# Patient Record
Sex: Male | Born: 1960 | Race: Black or African American | Hispanic: No | Smoking: Current some day smoker
Health system: Southern US, Community
[De-identification: ages and names within clinical notes are randomized; demographics above are authoritative.]

## PROBLEM LIST (undated history)

## (undated) DIAGNOSIS — E119 Type 2 diabetes mellitus without complications: Secondary | ICD-10-CM

## (undated) DIAGNOSIS — E785 Hyperlipidemia, unspecified: Secondary | ICD-10-CM

## (undated) DIAGNOSIS — I10 Essential (primary) hypertension: Secondary | ICD-10-CM

## (undated) DIAGNOSIS — F32A Depression, unspecified: Secondary | ICD-10-CM

## (undated) DIAGNOSIS — H409 Unspecified glaucoma: Secondary | ICD-10-CM

## (undated) DIAGNOSIS — M1990 Unspecified osteoarthritis, unspecified site: Secondary | ICD-10-CM

## (undated) DIAGNOSIS — G629 Polyneuropathy, unspecified: Secondary | ICD-10-CM

## (undated) DIAGNOSIS — Z8619 Personal history of other infectious and parasitic diseases: Secondary | ICD-10-CM

## (undated) DIAGNOSIS — F419 Anxiety disorder, unspecified: Secondary | ICD-10-CM

## (undated) HISTORY — DX: Type 2 diabetes mellitus without complications: E11.9

## (undated) HISTORY — DX: Essential (primary) hypertension: I10

## (undated) HISTORY — DX: Personal history of other infectious and parasitic diseases: Z86.19

## (undated) HISTORY — DX: Unspecified glaucoma: H40.9

## (undated) HISTORY — DX: Polyneuropathy, unspecified: G62.9

## (undated) HISTORY — PX: WISDOM TOOTH EXTRACTION: SHX21

## (undated) HISTORY — DX: Hyperlipidemia, unspecified: E78.5

---

## 2001-05-04 HISTORY — PX: VASECTOMY: SHX75

## 2001-05-04 HISTORY — PX: CYSTECTOMY: SUR359

## 2002-05-04 DIAGNOSIS — N529 Male erectile dysfunction, unspecified: Secondary | ICD-10-CM | POA: Insufficient documentation

## 2004-02-18 ENCOUNTER — Ambulatory Visit: Payer: Self-pay | Admitting: Family Medicine

## 2008-05-14 DIAGNOSIS — G4700 Insomnia, unspecified: Secondary | ICD-10-CM | POA: Insufficient documentation

## 2008-10-18 ENCOUNTER — Ambulatory Visit: Payer: Self-pay | Admitting: Family Medicine

## 2010-06-05 IMAGING — CR DG KNEE 1-2V*L*
1 series · 2 of 2 positions shown · non-contrast
Comparison: none

REASON FOR EXAM: knee pain
COMMENTS:

[Series 2: view not recorded · 0.17mm/px · 2 of 2 slices shown]
[im 1/2]
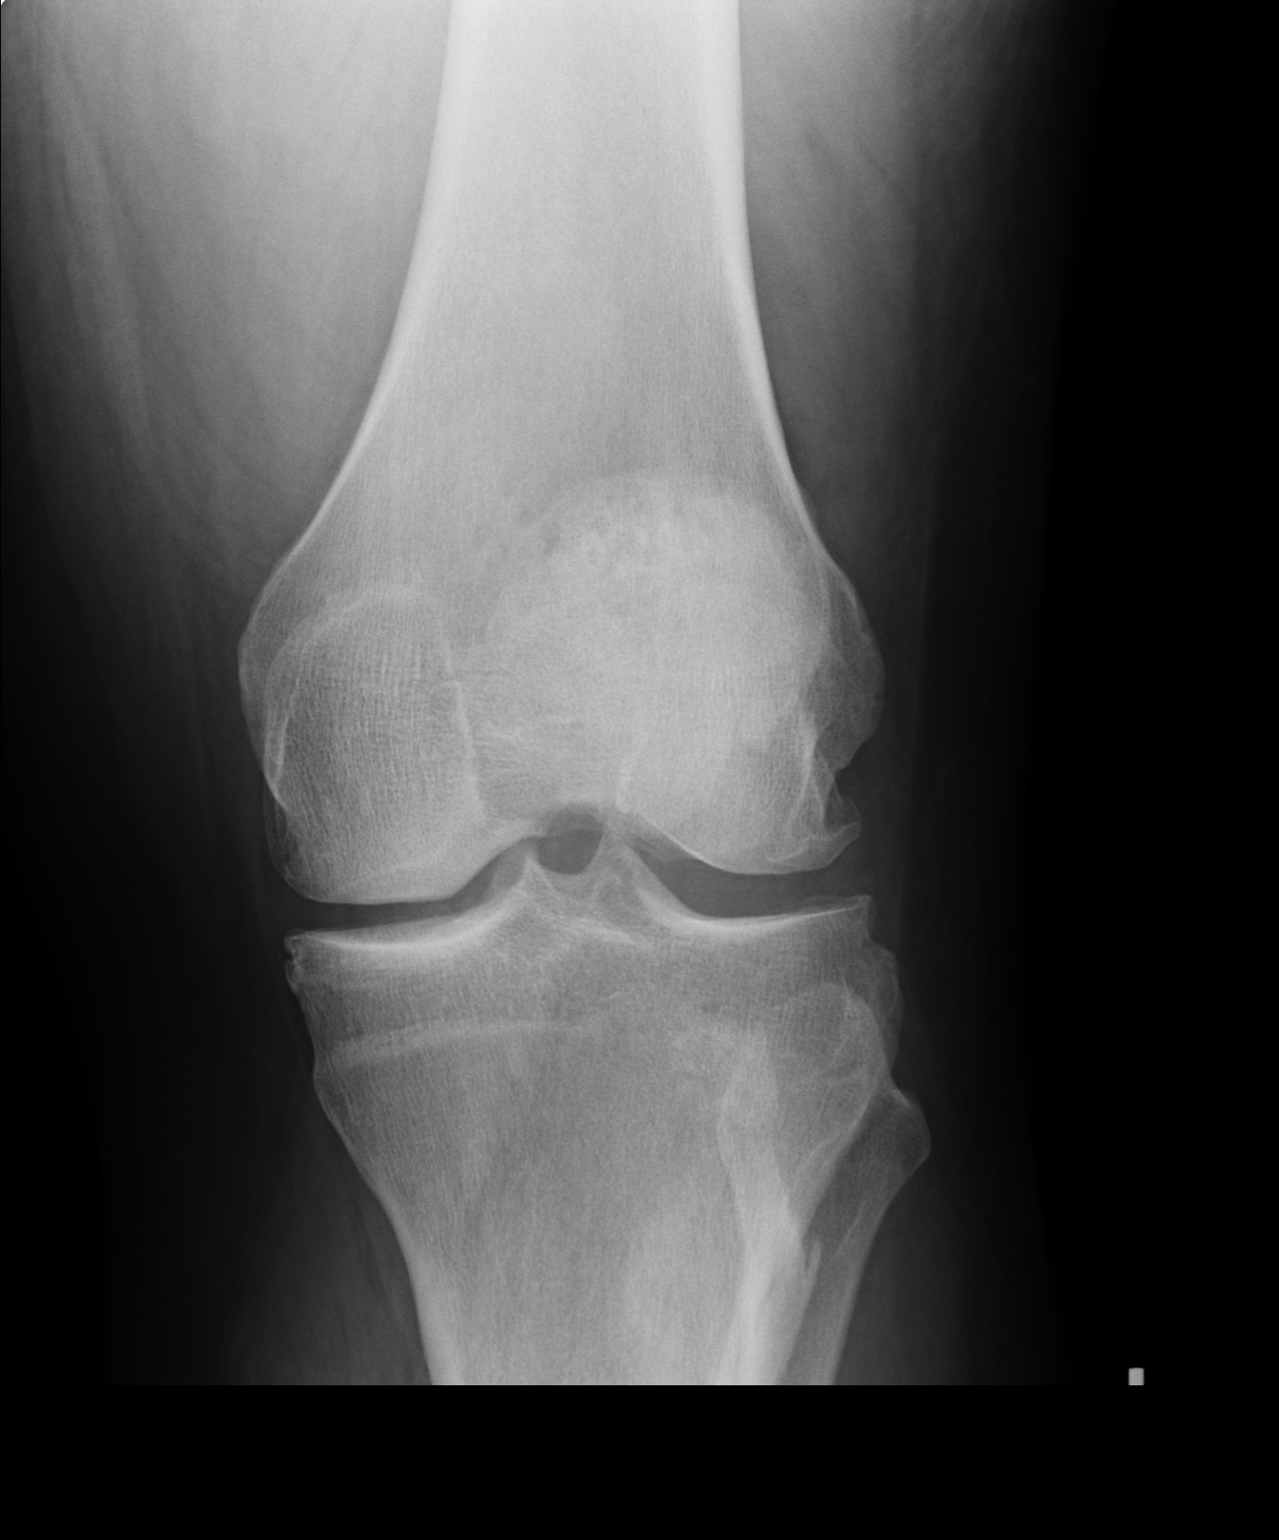
[im 2/2]
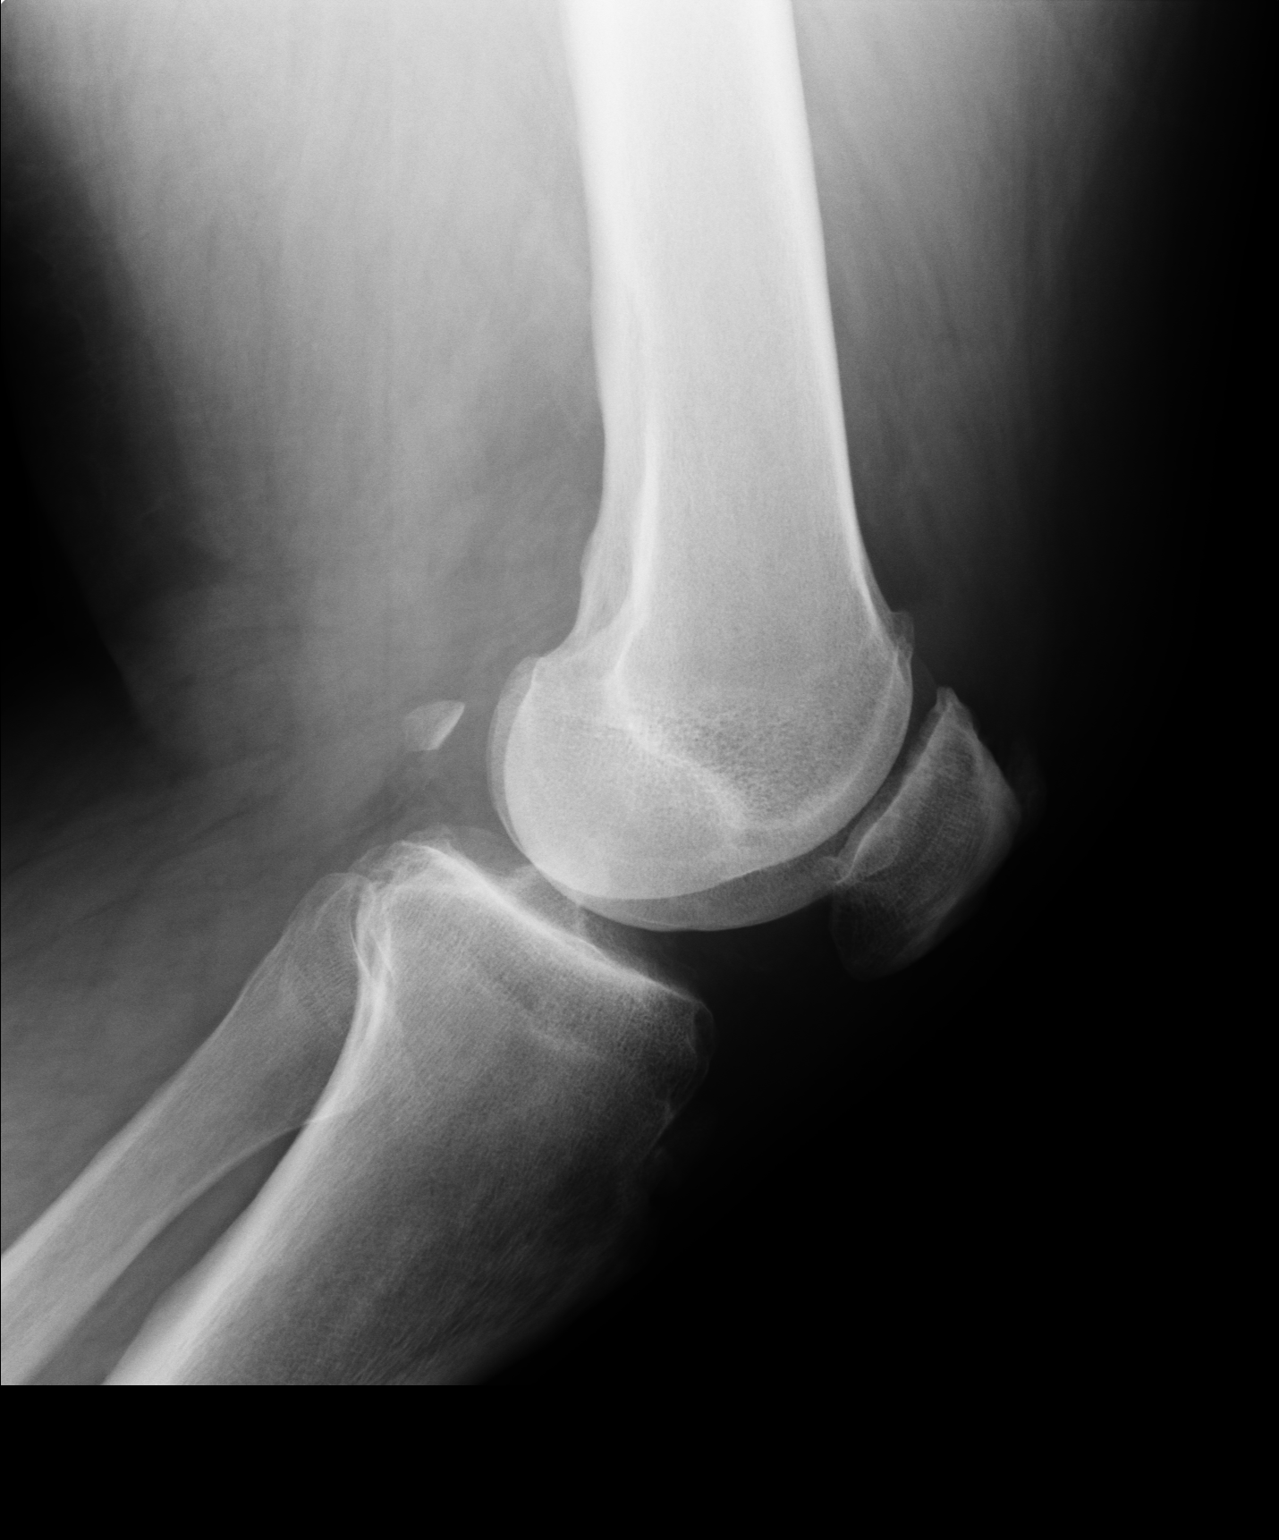

[2 of 2 positions shown; findings below may reference images not displayed]

PROCEDURE:     KDR - KDXR KNEE LEFT AP AND LATERAL  - October 18, 2008 [DATE]

RESULT:     No fracture, dislocation or other acute bony abnormality is
identified. There is slight degenerative spurring at the knee medially and
laterally. In the lateral view, there is noted slight dorsal patella
spurring. No sclerosis or cystic changes about the knee are seen.
IMPRESSION: 1. There is mild degenerative spurring at the knee joint and at the
femoropatellar joint.
2. The knee joint space appears slightly narrowed along its medial aspect.
3. No cystic changes or sclerosis about the knee joint is seen.

## 2011-02-18 LAB — BASIC METABOLIC PANEL
BUN: 13 mg/dL (ref 4–21)
CREATININE: 0.9 mg/dL (ref 0.6–1.3)
POTASSIUM: 4.1 mmol/L (ref 3.4–5.3)
SODIUM: 140 mmol/L (ref 137–147)

## 2011-08-14 ENCOUNTER — Other Ambulatory Visit (HOSPITAL_COMMUNITY): Payer: Self-pay | Admitting: Nephrology

## 2011-08-14 DIAGNOSIS — N186 End stage renal disease: Secondary | ICD-10-CM

## 2011-08-18 ENCOUNTER — Encounter (HOSPITAL_COMMUNITY): Payer: Self-pay | Admitting: Pharmacy Technician

## 2011-08-19 ENCOUNTER — Ambulatory Visit (HOSPITAL_COMMUNITY): Admission: RE | Admit: 2011-08-19 | Payer: Self-pay | Source: Ambulatory Visit

## 2011-12-15 LAB — LIPID PANEL
Cholesterol: 209 mg/dL — AB (ref 0–200)
HDL: 47 mg/dL (ref 35–70)
LDL Cholesterol: 138 mg/dL
Triglycerides: 118 mg/dL (ref 40–160)

## 2014-04-02 LAB — BASIC METABOLIC PANEL: Glucose: 206 mg/dL

## 2014-04-02 LAB — HEMOGLOBIN A1C: Hemoglobin A1C: >14

## 2014-12-29 ENCOUNTER — Other Ambulatory Visit: Payer: Self-pay | Admitting: Family Medicine

## 2015-01-09 ENCOUNTER — Other Ambulatory Visit: Payer: Self-pay | Admitting: Family Medicine

## 2015-01-18 ENCOUNTER — Other Ambulatory Visit: Payer: Self-pay | Admitting: Family Medicine

## 2015-02-01 ENCOUNTER — Other Ambulatory Visit: Payer: Self-pay

## 2015-02-01 DIAGNOSIS — I10 Essential (primary) hypertension: Secondary | ICD-10-CM | POA: Insufficient documentation

## 2015-02-01 MED ORDER — LISINOPRIL-HYDROCHLOROTHIAZIDE 20-25 MG PO TABS: 1.0000 | ORAL_TABLET | Freq: Every day | ORAL | Status: DC

## 2015-02-01 NOTE — Telephone Encounter (Signed)
Pharmacy requesting refill. [facility] [address] Patient's las OV for hypertension was 04/02/14.

## 2015-04-22 ENCOUNTER — Other Ambulatory Visit: Payer: Self-pay | Admitting: Family Medicine

## 2015-05-24 ENCOUNTER — Other Ambulatory Visit: Payer: Self-pay | Admitting: Family Medicine

## 2015-05-24 DIAGNOSIS — I10 Essential (primary) hypertension: Secondary | ICD-10-CM

## 2015-05-25 NOTE — Telephone Encounter (Signed)
Patient has not been seen in over a year. He needs to schedule follow up office visit before medications can be refilled.

## 2015-05-27 NOTE — Telephone Encounter (Signed)
Pt advised;  Made appointment for 06/07/2015 at 8am.  He is asking for a refill to get him until his appointment.   Thanks,   -Vernona Rieger

## 2015-06-05 DIAGNOSIS — H409 Unspecified glaucoma: Secondary | ICD-10-CM | POA: Insufficient documentation

## 2015-06-05 DIAGNOSIS — G629 Polyneuropathy, unspecified: Secondary | ICD-10-CM | POA: Insufficient documentation

## 2015-06-07 ENCOUNTER — Ambulatory Visit: Payer: Self-pay | Admitting: Family Medicine

## 2015-06-17 ENCOUNTER — Ambulatory Visit: Payer: Self-pay | Admitting: Family Medicine

## 2015-07-29 ENCOUNTER — Ambulatory Visit (INDEPENDENT_AMBULATORY_CARE_PROVIDER_SITE_OTHER): Payer: Self-pay | Admitting: Family Medicine

## 2015-07-29 ENCOUNTER — Encounter: Payer: Self-pay | Admitting: Family Medicine

## 2015-07-29 VITALS — BP 192/102 | HR 100 | Temp 98.2°F | Resp 16 | Ht 76.0 in | Wt 380.0 lb

## 2015-07-29 DIAGNOSIS — E1165 Type 2 diabetes mellitus with hyperglycemia: Secondary | ICD-10-CM

## 2015-07-29 DIAGNOSIS — I10 Essential (primary) hypertension: Secondary | ICD-10-CM

## 2015-07-29 DIAGNOSIS — Z91199 Patient's noncompliance with other medical treatment and regimen due to unspecified reason: Secondary | ICD-10-CM

## 2015-07-29 DIAGNOSIS — IMO0001 Reserved for inherently not codable concepts without codable children: Secondary | ICD-10-CM

## 2015-07-29 DIAGNOSIS — Z9119 Patient's noncompliance with other medical treatment and regimen: Secondary | ICD-10-CM

## 2015-07-29 LAB — POCT GLYCOSYLATED HEMOGLOBIN (HGB A1C)
ESTIMATED AVERAGE GLUCOSE: 286
HEMOGLOBIN A1C: 11.6

## 2015-07-29 MED ORDER — CEPHALEXIN 500 MG PO CAPS: 500.0000 mg | ORAL_CAPSULE | Freq: Four times a day (QID) | ORAL | Status: AC

## 2015-07-29 MED ORDER — AMLODIPINE BESYLATE 5 MG PO TABS: 5.0000 mg | ORAL_TABLET | Freq: Every day | ORAL | Status: DC

## 2015-07-29 MED ORDER — LISINOPRIL-HYDROCHLOROTHIAZIDE 20-25 MG PO TABS: 1.0000 | ORAL_TABLET | Freq: Every day | ORAL | Status: DC

## 2015-07-29 MED ORDER — METFORMIN HCL 1000 MG PO TABS: 1000.0000 mg | ORAL_TABLET | Freq: Two times a day (BID) | ORAL | Status: DC

## 2015-07-29 MED ORDER — LIRAGLUTIDE 18 MG/3ML ~~LOC~~ SOPN: PEN_INJECTOR | SUBCUTANEOUS | Status: DC

## 2015-07-29 NOTE — Progress Notes (Signed)
Patient: [name] Jensen Male    DOB: 10/29/1960   55 y.o.   MRN: [id number] Visit Date: 07/29/2015  Today's Provider: Mila Merry, MD   Chief Complaint  Patient presents with  . Diabetes    follow up  . Hypertension    follow up  . Hyperlipidemia    follow up   Subjective:    HPI  Diabetes Mellitus Type II, Follow-up:   Lab Results  Component Value Date   HGBA1C >14.0 04/02/2014   Last seen for diabetes 16  months ago.  Management since then includes starting Farxiga  daily. He reports fair compliance with treatment. Has been out of Metformin for a few weeks. He is not having side effects.  Current symptoms include numbness in feet and have been stable. Home blood sugar records: not being checked  Episodes of hypoglycemia? no   Current Insulin Regimen:none Most Recent Eye Exam: less than 1 year Weight trend: increasing steadily Prior visit with dietician: no Current diet: in general, a "healthy" diet   Current exercise: none  ------------------------------------------------------------------------   Hypertension, follow-up:  BP Readings from Last 3 Encounters:  04/02/14 142/94    He was last seen for hypertension 16  months ago.  BP at that visit was 142/94. Management since that visit includes no changes .He reports fair compliance with treatment. Has been out of Lisinopril for a few weeks. Patient has not been taking Amlodipine due to cost; patient is unemployed and has no insurance.  He is not having side effects.  He is not exercising. He is adherent to low salt diet.   Outside blood pressures are not being checked. He is experiencing dyspnea, fatigue, lower extremity edema and palpitations.  Patient denies chest pain, chest pressure/discomfort, exertional chest pressure/discomfort, irregular heart beat and near-syncope.   Cardiovascular risk factors include diabetes mellitus, dyslipidemia, hypertension, male gender, sedentary lifestyle and  smoking/ tobacco exposure.  Use of agents associated with hypertension: none.   ------------------------------------------------------------------------    Lipid/Cholesterol, Follow-up:   Last seen for this 3 years ago.  Management since that visit includes no changes.  Last Lipid Panel:    Component Value Date/Time   CHOL 209* 12/15/2011   TRIG 118 12/15/2011   HDL 47 12/15/2011   LDLCALC 138 12/15/2011    He reports good compliance with treatment. He is not having side effects.   Wt Readings from Last 3 Encounters:  04/02/14 361 lb (163.749 kg)    ------------------------------------------------------------------------      No Known Allergies Previous Medications   AMLODIPINE (NORVASC) 5 MG TABLET    Take 1 tablet by mouth daily.   BRIMONIDINE (ALPHAGAN P) 0.1 % SOLN    Place 1 drop into both eyes 2 (two) times daily.   GLIMEPIRIDE (AMARYL) 4 MG TABLET    take 1 tablet once daily   LATANOPROST (XALATAN) 0.005 % OPHTHALMIC SOLUTION    XALATAN, 0.005% (Ophthalmic Solution)  for 0 days  Quantity: 0.00;  Refills: 0   Ordered :11-Feb-2010  Mila Merry MD;  Started 04-May-2006 Active   LISINOPRIL-HYDROCHLOROTHIAZIDE (PRINZIDE,ZESTORETIC) 20-25 MG TABLET    take 1 tablet by mouth once daily   METFORMIN (GLUCOPHAGE) 1000 MG TABLET    Take 1 tablet by mouth 2 (two) times daily.   He is still taking Glimepiride. He states that Victoza worked well for him in the past, but could no afford prescription.   Review of Systems  Constitutional: Positive for fatigue. Negative for fever,  chills and appetite change.  Respiratory: Positive for shortness of breath. Negative for chest tightness and wheezing.   Cardiovascular: Positive for leg swelling (both legs). Negative for chest pain and palpitations.  Gastrointestinal: Negative for nausea, vomiting and abdominal pain.  Endocrine: Negative for cold intolerance, heat intolerance, polydipsia, polyphagia and polyuria.    Neurological: Positive for numbness (in feet) and headaches.    Social History  Substance Use Topics  . Smoking status: Current Some Day Smoker -- 23 years    Types: Cigars  . Smokeless tobacco: Not on file     Comment: smokes 3 cigars a week.   . Alcohol Use: 0.0 oz/week    0 Standard drinks or equivalent per week     Comment: occasionally    Objective:   BP 192/102 mmHg  Pulse 100  Temp(Src) 98.2 F (36.8 C) (Oral)  Resp 16  Ht 6\' 4"  (1.93 m)  Wt 380 lb (172.367 kg)  BMI 46.27 kg/m2  Physical Exam  General Appearance:    Alert, cooperative, no distress, obese  Eyes:    PERRL, conjunctiva/corneas clear, EOM's intact       Lungs:     Clear to auscultation bilaterally, respirations unlabored  Heart:    Regular rate and rhythm  Neurologic:   Awake, alert, oriented x 3. No apparent focal neurological           defect.       Results for orders placed or performed in visit on 07/29/15  POCT HgB A1C  Result Value Ref Range   Hemoglobin A1C 11.6    Est. average glucose Bld gHb Est-mCnc 286        Assessment & Plan:     1. Non compliance with medical treatment   2. Uncontrolled type 2 diabetes mellitus without complication, without long-term current use of insulin (HCC) Start back on metformin and add samples of Victoza which he did well with in the past.  - POCT HgB A1C  3. Essential hypertension Currently off of medications. Extensive counseled on danger of stroke and MI with untreated hypertension. Refilled lisinopril and amlodipine Advised that he needs to have renal functions before next refill.        Mila Merryonald Fisher, MD  [facility]

## 2015-07-29 NOTE — Patient Instructions (Addendum)
You need to get your kidney functions checked before your medications are refilled in April

## 2015-08-26 ENCOUNTER — Telehealth: Payer: Self-pay | Admitting: Family Medicine

## 2015-08-26 DIAGNOSIS — I10 Essential (primary) hypertension: Secondary | ICD-10-CM

## 2015-08-26 NOTE — Telephone Encounter (Signed)
Patient was notified. Patient will come by for labs and Bp check.

## 2015-08-26 NOTE — Telephone Encounter (Signed)
Please advise patient it is time to check labs since starting back on his blood pressure medications. He needs renal panel and needs to have BP checked when picks up lab slip. He is self pay, so lab has been ordered as Account Bill'.  Thanks.

## 2015-08-27 NOTE — Telephone Encounter (Signed)
Patient's Bp was 120/80.

## 2015-08-28 DIAGNOSIS — Z0000 Encounter for general adult medical examination without abnormal findings: Secondary | ICD-10-CM

## 2015-08-29 LAB — RENAL FUNCTION PANEL
ALBUMIN: 4.2 g/dL (ref 3.5–5.5)
BUN/Creatinine Ratio: 13 (ref 9–20)
BUN: 12 mg/dL (ref 6–24)
CHLORIDE: 95 mmol/L — AB (ref 96–106)
CO2: 27 mmol/L (ref 18–29)
Calcium: 10.1 mg/dL (ref 8.7–10.2)
Creatinine, Ser: 0.9 mg/dL (ref 0.76–1.27)
GFR calc non Af Amer: 98 mL/min/{1.73_m2} (ref >59)
GFR, EST AFRICAN AMERICAN: 113 mL/min/{1.73_m2} (ref >59)
GLUCOSE: 191 mg/dL — AB (ref 65–99)
PHOSPHORUS: 3.9 mg/dL (ref 2.5–4.5)
POTASSIUM: 5.1 mmol/L (ref 3.5–5.2)
SODIUM: 141 mmol/L (ref 134–144)

## 2015-09-22 ENCOUNTER — Other Ambulatory Visit: Payer: Self-pay | Admitting: Family Medicine

## 2015-11-29 ENCOUNTER — Telehealth: Payer: Self-pay | Admitting: Family Medicine

## 2015-11-29 NOTE — Telephone Encounter (Signed)
Pt called to see if he could get samples of Liraglutide (VICTOZA) 18 MG/3ML SOPN if we had any samples in the office. I advised pt that Dr. Sherrie Mustache is out of the office until 12/03/15. Please advise. Thanks TNP

## 2015-12-27 ENCOUNTER — Other Ambulatory Visit: Payer: Self-pay | Admitting: Family Medicine

## 2015-12-27 NOTE — Telephone Encounter (Signed)
LOV 07/29/2015. Allene DillonEmily Drozdowski, CMA

## 2016-02-04 ENCOUNTER — Other Ambulatory Visit: Payer: Self-pay | Admitting: Family Medicine

## 2016-02-04 NOTE — Telephone Encounter (Signed)
Patient has uncontrolled with a1c of 11 in March. He needs follow up o.v. Have sent refill to get by for a few weeks until he can be seen.

## 2016-03-06 ENCOUNTER — Ambulatory Visit (INDEPENDENT_AMBULATORY_CARE_PROVIDER_SITE_OTHER): Payer: Self-pay | Admitting: Family Medicine

## 2016-03-06 ENCOUNTER — Encounter: Payer: Self-pay | Admitting: Family Medicine

## 2016-03-06 VITALS — BP 130/80 | HR 85 | Temp 98.7°F | Resp 16 | Ht 76.0 in | Wt 367.0 lb

## 2016-03-06 DIAGNOSIS — IMO0001 Reserved for inherently not codable concepts without codable children: Secondary | ICD-10-CM

## 2016-03-06 DIAGNOSIS — Z9119 Patient's noncompliance with other medical treatment and regimen: Secondary | ICD-10-CM

## 2016-03-06 DIAGNOSIS — Z91199 Patient's noncompliance with other medical treatment and regimen due to unspecified reason: Secondary | ICD-10-CM

## 2016-03-06 DIAGNOSIS — I10 Essential (primary) hypertension: Secondary | ICD-10-CM

## 2016-03-06 DIAGNOSIS — E1165 Type 2 diabetes mellitus with hyperglycemia: Secondary | ICD-10-CM

## 2016-03-06 LAB — POCT GLYCOSYLATED HEMOGLOBIN (HGB A1C)

## 2016-03-06 MED ORDER — DAPAGLIFLOZIN PROPANEDIOL 10 MG PO TABS: ORAL_TABLET | ORAL | 0 refills | Status: DC

## 2016-03-06 MED ORDER — DULAGLUTIDE 0.75 MG/0.5ML ~~LOC~~ SOAJ: 0.7500 mg | SUBCUTANEOUS | 3 refills | Status: DC

## 2016-03-06 MED ORDER — AMLODIPINE BESYLATE 5 MG PO TABS: 5.0000 mg | ORAL_TABLET | Freq: Every day | ORAL | 2 refills | Status: DC

## 2016-03-06 MED ORDER — LISINOPRIL-HYDROCHLOROTHIAZIDE 20-25 MG PO TABS: 1.0000 | ORAL_TABLET | Freq: Every day | ORAL | 3 refills | Status: DC

## 2016-03-06 MED ORDER — CEPHALEXIN 500 MG PO CAPS: 500.0000 mg | ORAL_CAPSULE | Freq: Four times a day (QID) | ORAL | 2 refills | Status: DC

## 2016-03-06 NOTE — Progress Notes (Signed)
Patient: [name] Jensen Male    DOB: May 17, 1960   55 y.o.   MRN: [id number] Visit Date: 03/06/2016  Today's Provider: Mila Merryonald Shandrell Boda, MD   Chief Complaint  Patient presents with  . Follow-up  . Diabetes  . Hypertension   Subjective:    Patient is having a numb feeling in his feet. Patient also said he has a sluggish feeling most of the time.     Diabetes Mellitus Type II, Follow-up:   Lab Results  Component Value Date   HGBA1C >14.0 03/06/2016   HGBA1C 11.6 07/29/2015   HGBA1C >14.0 04/02/2014   Last seen for diabetes 8 months ago.  Management since then includes; started back on metformin and added samples of victoza. He states he feels very fatigued when he takes 1.8mg  of Victoza, so he occasionally stops for a few days, and has recently only been taking 0.6mg .  He is not having side effects. none Current symptoms include none and have been unchanged. Home blood sugar records: fasting range: not checking  Episodes of hypoglycemia? no   Current Insulin Regimen: n/a Most Recent Eye Exam: due Weight trend: stable Prior visit with dietician: no Current diet: well balanced Current exercise: none  ----------------------------------------------------------------   Hypertension, follow-up:  BP Readings from Last 3 Encounters:  03/06/16 130/80  07/29/15 (!) 192/102  04/02/14 (!) 142/94    He was last seen for hypertension 8 months ago.  BP at that visit was 192/102. Management since that visit includes; patient counseled to take meds.He reports good compliance with treatment. He is not having side effects. none He is not exercising. He is not adherent to low salt diet.   Outside blood pressures are n/a. He is experiencing none.  Patient denies none.   Cardiovascular risk factors include diabetes.  Use of agents associated with hypertension: none.   ----------------------------------------------------------------    No Known Allergies   Current  Outpatient Prescriptions:  .  amLODipine (NORVASC) 5 MG tablet, Take 1 tablet (5 mg total) by mouth daily. Reported on 07/29/2015, Disp: 30 tablet, Rfl: 2 .  brimonidine (ALPHAGAN P) 0.1 % SOLN, Place 1 drop into both eyes 2 (two) times daily., Disp: , Rfl:  .  cephALEXin (KEFLEX) 500 MG capsule, Take 1 capsule by mouth 4 (four) times daily., Disp: , Rfl:  .  glimepiride (AMARYL) 4 MG tablet, take 1 tablet once daily, Disp: 15 tablet, Rfl: 0 .  latanoprost (XALATAN) 0.005 % ophthalmic solution, XALATAN, 0.005% (Ophthalmic Solution)  for 0 days  Quantity: 0.00;  Refills: 0   Ordered :11-Feb-2010  Mila MerryFisher, Elim Peale MD;  Started 04-May-2006 Active, Disp: , Rfl:  .  Liraglutide (VICTOZA) 18 MG/3ML SOPN, Titrate up to 1.8mg  daily, Disp: 12 mL, Rfl: 0 .  lisinopril-hydrochlorothiazide (PRINZIDE,ZESTORETIC) 20-25 MG tablet, take 1 tablet by mouth once daily ....Marland Kitchen.PATIENT NEEDS TO SCHEDULE OFFICE VISIT FOR FOLLOW UP, Disp: 15 tablet, Rfl: 0 .  metFORMIN (GLUCOPHAGE) 1000 MG tablet, take 1 tablet by mouth twice a day, Disp: 60 tablet, Rfl: 2  Review of Systems  Constitutional: Negative for appetite change, chills and fever.  Respiratory: Negative for chest tightness, shortness of breath and wheezing.   Cardiovascular: Negative for chest pain and palpitations.  Gastrointestinal: Negative for abdominal pain, nausea and vomiting.  Neurological: Positive for numbness.    Social History  Substance Use Topics  . Smoking status: Current Some Day Smoker    Years: 23.00    Types: Cigars  . Smokeless tobacco:  Not on file     Comment: smokes 3 cigars a week.   . Alcohol use 0.0 oz/week     Comment: occasionally    Objective:   BP 130/80 (BP Location: Right Arm, Patient Position: Sitting, Cuff Size: Large)   Pulse 85   Temp 98.7 F (37.1 C) (Oral)   Resp 16   Ht 6\' 4"  (1.93 m)   Wt (!) 367 lb (166.5 kg)   SpO2 96%   BMI 44.67 kg/m   Physical Exam  General Appearance:    Alert, cooperative, no  distress, morbidly obese  Eyes:    PERRL, conjunctiva/corneas clear, EOM's intact       Lungs:     Clear to auscultation bilaterally, respirations unlabored  Heart:    Regular rate and rhythm  Neurologic:   Awake, alert, oriented x 3. No apparent focal neurological           defect.       Results for orders placed or performed in visit on 03/06/16  POCT glycosylated hemoglobin (Hb A1C)  Result Value Ref Range   Hemoglobin A1C >14.0    Est. average glucose Bld gHb Est-mCnc >326         Assessment & Plan:     1. Uncontrolled diabetes mellitus type 2 without complications, unspecified long term insulin use status (HCC) Not tolerating therapeutic dose of Victoza. Start on ThayerFarxiga and recheck in 4-6 weeks.  - POCT glycosylated hemoglobin (Hb A1C) - dapagliflozin propanediol (FARXIGA) 10 MG TABS tablet; Take 1/2 tablet daily  Dispense: 28 tablet; Refill: 0  2. Essential hypertension Well controlled since being compliant with medications.  - amLODipine (NORVASC) 5 MG tablet; Take 1 tablet (5 mg total) by mouth daily. Reported on 07/29/2015  Dispense: 30 tablet; Refill: 2 - lisinopril-hydrochlorothiazide (PRINZIDE,ZESTORETIC) 20-25 MG tablet; Take 1 tablet by mouth daily.  Dispense: 30 tablet; Refill: 3  3. Non compliance with medical treatment        Mila Merryonald Tambi Thole, MD  [facility]

## 2016-03-06 NOTE — Patient Instructions (Signed)
Diabetes and Foot Care Diabetes may cause you to have problems because of poor blood supply (circulation) to your feet and legs. This may cause the skin on your feet to become thinner, break easier, and heal more slowly. Your skin may become dry, and the skin may peel and crack. You may also have nerve damage in your legs and feet causing decreased feeling in them. You may not notice minor injuries to your feet that could lead to infections or more serious problems. Taking care of your feet is one of the most important things you can do for yourself.  HOME CARE INSTRUCTIONS  Wear shoes at all times, even in the house. Do not go barefoot. Bare feet are easily injured.  Check your feet daily for blisters, cuts, and redness. If you cannot see the bottom of your feet, use a mirror or ask someone for help.  Wash your feet with warm water (do not use hot water) and mild soap. Then pat your feet and the areas between your toes until they are completely dry. Do not soak your feet as this can dry your skin.  Apply a moisturizing lotion or petroleum jelly (that does not contain alcohol and is unscented) to the skin on your feet and to dry, brittle toenails. Do not apply lotion between your toes.  Trim your toenails straight across. Do not dig under them or around the cuticle. File the edges of your nails with an emery board or nail file.  Do not cut corns or calluses or try to remove them with medicine.  Wear clean socks or stockings every day. Make sure they are not too tight. Do not wear knee-high stockings since they may decrease blood flow to your legs.  Wear shoes that fit properly and have enough cushioning. To break in new shoes, wear them for just a few hours a day. This prevents you from injuring your feet. Always look in your shoes before you put them on to be sure there are no objects inside.  Do not cross your legs. This may decrease the blood flow to your feet.  If you find a minor scrape,  cut, or break in the skin on your feet, keep it and the skin around it clean and dry. These areas may be cleansed with mild soap and water. Do not cleanse the area with peroxide, alcohol, or iodine.  When you remove an adhesive bandage, be sure not to damage the skin around it.  If you have a wound, look at it several times a day to make sure it is healing.  Do not use heating pads or hot water bottles. They may burn your skin. If you have lost feeling in your feet or legs, you may not know it is happening until it is too late.  Make sure your health care provider performs a complete foot exam at least annually or more often if you have foot problems. Report any cuts, sores, or bruises to your health care provider immediately. SEEK MEDICAL CARE IF:   You have an injury that is not healing.  You have cuts or breaks in the skin.  You have an ingrown nail.  You notice redness on your legs or feet.  You feel burning or tingling in your legs or feet.  You have pain or cramps in your legs and feet.  Your legs or feet are numb.  Your feet always feel cold. SEEK IMMEDIATE MEDICAL CARE IF:   There is increasing redness,   swelling, or pain in or around a wound.  There is a red line that goes up your leg.  Pus is coming from a wound.  You develop a fever or as directed by your health care provider.  You notice a bad smell coming from an ulcer or wound.   This information is not intended to replace advice given to you by your health care provider. Make sure you discuss any questions you have with your health care provider.   Document Released: 04/17/2000 Document Revised: 12/21/2012 Document Reviewed: 09/27/2012 Elsevier Interactive Patient Education 2016 [employer]  

## 2016-03-07 ENCOUNTER — Other Ambulatory Visit: Payer: Self-pay | Admitting: Family Medicine

## 2016-04-06 ENCOUNTER — Telehealth: Payer: Self-pay | Admitting: Family Medicine

## 2016-04-06 DIAGNOSIS — IMO0001 Reserved for inherently not codable concepts without codable children: Secondary | ICD-10-CM

## 2016-04-06 DIAGNOSIS — E1165 Type 2 diabetes mellitus with hyperglycemia: Principal | ICD-10-CM

## 2016-04-06 MED ORDER — DAPAGLIFLOZIN PROPANEDIOL 10 MG PO TABS: ORAL_TABLET | ORAL | 0 refills | Status: DC

## 2016-04-06 NOTE — Telephone Encounter (Signed)
Please check with patient and see if has been taking samples of Marcelline DeistFarxiga that we got for him. If tolerating well then we can get him more samples. I don't think we have any more Farxiga, but we can substitute samples of Jardiance 10mg  one a day. He can have 6 sample packs and schedule follow up in 5-6 weeks.

## 2016-04-06 NOTE — Telephone Encounter (Signed)
We did have 6 weeks of farxiga 10 mg. Patient was notified.

## 2016-04-06 NOTE — Telephone Encounter (Signed)
Patient stated that he is still taking samples of Farxiga. Patient stated that he has been tolerating them week and he has about 1 1/2 weeks of samples left.

## 2016-04-17 ENCOUNTER — Ambulatory Visit: Payer: Self-pay | Admitting: Family Medicine

## 2016-06-23 ENCOUNTER — Telehealth: Payer: Self-pay | Admitting: Family Medicine

## 2016-06-23 DIAGNOSIS — E1165 Type 2 diabetes mellitus with hyperglycemia: Principal | ICD-10-CM

## 2016-06-23 DIAGNOSIS — IMO0001 Reserved for inherently not codable concepts without codable children: Secondary | ICD-10-CM

## 2016-06-23 NOTE — Telephone Encounter (Signed)
Please contact patient and let him know we have 6 weeks of Farxiga 10mg  samples that he can pick up.

## 2016-06-23 NOTE — Telephone Encounter (Signed)
LMOVM for pt to return call 

## 2016-06-23 NOTE — Telephone Encounter (Signed)
-----   Message from Malva Limesonald E Fisher, MD sent at 04/13/2016  8:04 AM EST ----- Regarding: follow up early jan. for farxiga. is on samples.    ----- Message ----- From: Malva Limesonald E Fisher, MD Sent: 04/13/2016 To: Malva Limesonald E Fisher, MD Subject: FW: check to see if tolerating Marcelline DeistFarxiga early#  Message to fisher nurse 04/06/16 ----- Message ----- From: Malva Limesonald E Fisher, MD Sent: 04/04/2016 To: Malva Limesonald E Fisher, MD Subject: check to see if tolerating Marcelline DeistFarxiga early Dec#

## 2016-06-25 MED ORDER — DAPAGLIFLOZIN PROPANEDIOL 10 MG PO TABS: ORAL_TABLET | ORAL | 0 refills | Status: DC

## 2016-06-25 NOTE — Telephone Encounter (Signed)
Patient was notified.

## 2016-06-25 NOTE — Addendum Note (Signed)
Addended by: Marlene LardMILLER, Lavelle Berland M on: 06/25/2016 08:49 AM   Modules accepted: Orders

## 2016-07-10 ENCOUNTER — Other Ambulatory Visit: Payer: Self-pay | Admitting: Family Medicine

## 2016-07-10 DIAGNOSIS — I10 Essential (primary) hypertension: Secondary | ICD-10-CM

## 2016-08-14 ENCOUNTER — Other Ambulatory Visit: Payer: Self-pay | Admitting: Family Medicine

## 2016-08-17 ENCOUNTER — Telehealth: Payer: Self-pay | Admitting: Family Medicine

## 2016-08-17 NOTE — Telephone Encounter (Signed)
LMTCB ED 

## 2016-08-17 NOTE — Telephone Encounter (Signed)
Please check with patient to see if he is doing ok with [place]. If so we have more samples of Farxiga 10. He can have 5 boxes and need to schedule follow up in 3-4 weeks to check a1c

## 2016-08-17 NOTE — Telephone Encounter (Signed)
-----   Message from Malva Limes, MD sent at 06/25/2016  3:07 PM EST ----- Regarding: smaples farxiga 10 and o.v. in April.    ----- Message ----- From: Malva Limes, MD Sent: 06/24/2016 To: Malva Limes, MD Subject: FW: follow up early jan. for farxiga. is on #  Message sent 06-23-2016  ----- Message ----- From: Malva Limes, MD Sent: 05/08/2016 To: Malva Limes, MD Subject: follow up early jan. for farxiga. is on samp#    ----- Message ----- From: Malva Limes, MD Sent: 04/13/2016 To: Malva Limes, MD Subject: FW: check to see if tolerating Marcelline Deist early#  Message to Tatsuya Okray nurse 04/06/16 ----- Message ----- From: Malva Limes, MD Sent: 04/04/2016 To: Malva Limes, MD Subject: check to see if tolerating Marcelline Deist early Dec#

## 2016-09-15 ENCOUNTER — Ambulatory Visit: Payer: Self-pay | Admitting: Family Medicine

## 2016-09-22 ENCOUNTER — Ambulatory Visit: Payer: Self-pay | Admitting: Family Medicine

## 2016-10-02 ENCOUNTER — Telehealth: Payer: Self-pay

## 2016-10-02 NOTE — Telephone Encounter (Signed)
FYI::: Patient had to cancel follow up appointment scheduled for 6/5 and wanted to let Dr. Sherrie MustacheFisher know he will call back to reschedule appointment when he can, but at this time he is unable to. Patient did not give a reason.

## 2016-10-02 NOTE — Telephone Encounter (Signed)
[id number] 

## 2016-10-06 ENCOUNTER — Ambulatory Visit: Payer: Self-pay | Admitting: Family Medicine

## 2016-11-03 ENCOUNTER — Telehealth: Payer: Self-pay | Admitting: *Deleted

## 2016-11-03 MED ORDER — DAPAGLIFLOZIN PROPANEDIOL 10 MG PO TABS: ORAL_TABLET | ORAL | 0 refills | Status: DC

## 2016-11-03 NOTE — Telephone Encounter (Signed)
Samples for farxiga at front desk for pickup.

## 2016-11-12 ENCOUNTER — Other Ambulatory Visit: Payer: Self-pay | Admitting: Family Medicine

## 2016-11-12 DIAGNOSIS — I10 Essential (primary) hypertension: Secondary | ICD-10-CM

## 2016-11-23 ENCOUNTER — Encounter: Payer: Self-pay | Admitting: Family Medicine

## 2016-11-23 ENCOUNTER — Ambulatory Visit (INDEPENDENT_AMBULATORY_CARE_PROVIDER_SITE_OTHER): Payer: Self-pay | Admitting: Family Medicine

## 2016-11-23 VITALS — BP 120/70 | HR 94 | Temp 98.9°F | Resp 16 | Wt 362.0 lb

## 2016-11-23 DIAGNOSIS — E1149 Type 2 diabetes mellitus with other diabetic neurological complication: Secondary | ICD-10-CM

## 2016-11-23 DIAGNOSIS — E1165 Type 2 diabetes mellitus with hyperglycemia: Secondary | ICD-10-CM

## 2016-11-23 LAB — POCT GLYCOSYLATED HEMOGLOBIN (HGB A1C)
ESTIMATED AVERAGE GLUCOSE: 263
HEMOGLOBIN A1C: 10.8

## 2016-11-23 NOTE — Patient Instructions (Signed)
Recommend taking 81mg  enteric coated aspirin to reduce risk of vascular events such as heart attacks and strokes.     Start taking 1,000mg  vitamin B12 (dissolvable tablets) for neuropathy   Diabetic Neuropathy Diabetic neuropathy is a nerve disease or nerve damage that is caused by diabetes mellitus. About half of all people with diabetes mellitus have some form of nerve damage. Nerve damage is more common in those who have had diabetes mellitus for many years and who generally have not had good control of their blood sugar (glucose) level. Diabetic neuropathy is a common complication of diabetes mellitus. There are three common types of diabetic neuropathy and a fourth type that is less common and less understood:  Peripheral neuropathy-This is the most common type of diabetic neuropathy. It causes damage to the nerves of the feet and legs first and then eventually the hands and arms. The damage affects the ability to sense touch.  Autonomic neuropathy-This type causes damage to the autonomic nervous system, which controls the following functions: ? Heartbeat. ? Body temperature. ? Blood pressure. ? Urination. ? Digestion. ? Sweating. ? Sexual function.  Focal neuropathy-Focal neuropathy can be painful and unpredictable and occurs most often in older adults with diabetes mellitus. It involves a specific nerve or one area and often comes on suddenly. It usually does not cause long-term problems.  Radiculoplexus neuropathy- Sometimes called lumbosacral radiculoplexus neuropathy, radiculoplexus neuropathy affects the nerves of the thighs, hips, buttocks, or legs. It is more common in people with type 2 diabetes mellitus and in older men. It is characterized by debilitating pain, weakness, and atrophy, usually in the thigh muscles.  What are the causes? The cause of peripheral, autonomic, and focal neuropathies is diabetes mellitus that is uncontrolled and high glucose levels. The cause of  radiculoplexus neuropathy is unknown. However, it is thought to be caused by inflammation related to uncontrolled glucose levels. What are the signs or symptoms? Peripheral Neuropathy Peripheral neuropathy develops slowly over time. When the nerves of the feet and legs no longer work there may be:  Burning, stabbing, or aching pain in the legs or feet.  Inability to feel pressure or pain in your feet. This can lead to: ? Thick calluses over pressure areas. ? Pressure sores. ? Ulcers.  Foot deformities.  Reduced ability to feel temperature changes.  Muscle weakness.  Autonomic Neuropathy The symptoms of autonomic neuropathy vary depending on which nerves are affected. Symptoms may include:  Problems with digestion, such as: ? Feeling sick to your stomach (nausea). ? Vomiting. ? Bloating. ? Constipation. ? Diarrhea. ? Abdominal pain.  Difficulty with urination. This occurs if you lose your ability to sense when your bladder is full. Problems include: ? Urine leakage (incontinence). ? Inability to empty your bladder completely (retention).  Rapid or irregular heartbeat (palpitations).  Blood pressure drops when you stand up (orthostatic hypotension). When you stand up you may feel: ? Dizzy. ? Weak. ? Faint.  In men, inability to attain and maintain an erection.  In women, vaginal dryness and problems with decreased sexual desire and arousal.  Problems with body temperature regulation.  Increased or decreased sweating.  Focal Neuropathy  Abnormal eye movements or abnormal alignment of both eyes.  Weakness in the wrist.  Foot drop. This results in an inability to lift the foot properly and abnormal walking or foot movement.  Paralysis on one side of your face (Bell palsy).  Chest or abdominal pain. Radiculoplexus Neuropathy  Sudden, severe pain in your  hip, thigh, or buttocks.  Weakness and wasting of thigh muscles.  Difficulty rising from a seated  position.  Abdominal swelling.  Unexplained weight loss (usually more than 10 lb [4.5 kg]). How is this diagnosed? Peripheral Neuropathy Your senses may be tested. Sensory function testing can be done with:  A light touch using a monofilament.  A vibration with tuning fork.  A sharp sensation with a pin prick.  Other tests that can help diagnose neuropathy are:  Nerve conduction velocity. This test checks the transmission of an electrical current through a nerve.  Electromyography. This shows how muscles respond to electrical signals transmitted by nearby nerves.  Quantitative sensory testing. This is used to assess how your nerves respond to vibrations and changes in temperature.  Autonomic Neuropathy Diagnosis is often based on reported symptoms. Tell your health care provider if you experience:  Dizziness.  Constipation.  Diarrhea.  Inappropriate urination or inability to urinate.  Inability to get or maintain an erection.  Tests that may be done include:  Electrocardiography or Holter monitor. These are tests that can help show problems with the heart rate or heart rhythm.  An X-ray exam may be done.  Focal Neuropathy Diagnosis is made based on your symptoms and what your health care provider finds during your exam. Other tests may be done. They may include:  Nerve conduction velocities. This checks the transmission of electrical current through a nerve.  Electromyography. This shows how muscles respond to electrical signals transmitted by nearby nerves.  Quantitative sensory testing. This test is used to assess how your nerves respond to vibration and changes in temperature.  Radiculoplexus Neuropathy  Often the first thing is to eliminate any other issue or problems that might be the cause, as there is no standard test for diagnosis.  X-ray exam of your spine and lumbar region.  Spinal tap to rule out cancer.  MRI to rule out other lesions. How is  this treated? Once nerve damage occurs, it cannot be reversed. The goal of treatment is to keep the disease or nerve damage from getting worse and affecting more nerve fibers. Controlling your blood glucose level is the key. Most people with radiculoplexus neuropathy see at least a partial improvement over time. You will need to keep your blood glucose and HbA1c levels in the target range determined by your health care provider. Things that help control blood glucose levels include:  Blood glucose monitoring.  Meal planning.  Physical activity.  Diabetes medicine.  Over time, maintaining lower blood glucose levels helps lessen symptoms. Sometimes, prescription pain medicine is needed. Follow these instructions at home:  Do not smoke.  Keep your blood glucose level in the range that you and your health care provider have determined acceptable for you.  Keep your blood pressure level in the range that you and your health care provider have determined acceptable for you.  Eat a well-balanced diet.  Be physically active every day. Include strength training and balance exercises.  Protect your feet. ? Check your feet every day for sores, cuts, blisters, or signs of infection. ? Wear padded socks and supportive shoes. Use orthotic inserts, if necessary. ? Regularly check the insides of your shoes for worn spots. Make sure there are no rocks or other items inside your shoes before you put them on. Contact a health care provider if:  You have burning, stabbing, or aching pain in the legs or feet.  You are unable to feel pressure or pain in your  feet.  You develop problems with digestion such as: ? Nausea. ? Vomiting. ? Bloating. ? Constipation. ? Diarrhea. ? Abdominal pain.  You have difficulty with urination, such as: ? Incontinence. ? Retention.  You have palpitations.  You develop orthostatic hypotension. When you stand up you may feel: ? Dizzy. ? Weak. ? Faint.  You  cannot attain and maintain an erection (in men).  You have vaginal dryness and problems with decreased sexual desire and arousal (in women).  You have severe pain in your thighs, legs, or buttocks.  You have unexplained weight loss. This information is not intended to replace advice given to you by your health care provider. Make sure you discuss any questions you have with your health care provider. Document Released: 06/29/2001 Document Revised: 09/26/2015 Document Reviewed: 09/29/2012 Elsevier Interactive Patient Education  2017 [employer]

## 2016-11-23 NOTE — Progress Notes (Signed)
Patient: [name] ReamerLarry D Benyo Male    DOB: 27-Apr-1961   56 y.o.   MRN: [id number] Visit Date: 11/23/2016  Today's Provider: Mila Merryonald Fisher, MD   Chief Complaint  Patient presents with  . Hypertension    follow up  . Diabetes    follow up   Subjective:    HPI  Hypertension, follow-up:  BP Readings from Last 3 Encounters:  03/06/16 130/80  07/29/15 (!) 192/102  04/02/14 (!) 142/94    He was last seen for hypertension 8 months ago.  BP at that visit was 130/80. Management since that visit includes no changes. He reports good compliance with treatment. He is not having side effects.  He is exercising. He is adherent to low salt diet.   Outside blood pressures are 125/80. He is experiencing none.  Patient denies chest pain, chest pressure/discomfort, claudication, dyspnea, exertional chest pressure/discomfort, fatigue, irregular heart beat, lower extremity edema, near-syncope, orthopnea, palpitations, paroxysmal nocturnal dyspnea, syncope and tachypnea.   Cardiovascular risk factors include diabetes mellitus, hypertension and male gender.  Use of agents associated with hypertension: none.     Weight trend: fluctuating a bit Wt Readings from Last 3 Encounters:  03/06/16 (!) 367 lb (166.5 kg)  07/29/15 (!) 380 lb (172.4 kg)  04/02/14 (!) 361 lb (163.7 kg)    Current diet: well balanced  ------------------------------------------------------------------------  Diabetes Mellitus Type II, Follow-up:   Lab Results  Component Value Date   HGBA1C >14.0 03/06/2016   HGBA1C 11.6 07/29/2015   HGBA1C >14.0 04/02/2014    Last seen for diabetes 8 months ago.  Management since then includes starting Farxiga. He reports fair compliance with treatment. He is not having side effects.  Current symptoms include paresthesia of the feet and have been stable. Home blood sugar records: checks blood sugars occasionally  Episodes of hypoglycemia? no   Current Insulin Regimen:  none Most Recent Eye Exam: < 1 year ago Weight trend: fluctuating a bit Prior visit with dietician: no Current diet: well balanced Current exercise: walking  Pertinent Labs:    Component Value Date/Time   CHOL 209 (A) 12/15/2011   TRIG 118 12/15/2011   HDL 47 12/15/2011   LDLCALC 138 12/15/2011   CREATININE 0.90 08/28/2015 1404    Wt Readings from Last 3 Encounters:  03/06/16 (!) 367 lb (166.5 kg)  07/29/15 (!) 380 lb (172.4 kg)  04/02/14 (!) 361 lb (163.7 kg)    ------------------------------------------------------------------------     No Known Allergies   Current Outpatient Prescriptions:  .  amLODipine (NORVASC) 5 MG tablet, Take 1 tablet (5 mg total) by mouth daily. Reported on 07/29/2015, Disp: 30 tablet, Rfl: 2 .  brimonidine (ALPHAGAN P) 0.1 % SOLN, Place 1 drop into both eyes 2 (two) times daily., Disp: , Rfl:  .  dapagliflozin propanediol (FARXIGA) 10 MG TABS tablet, Take 1 tablet daily, Disp: 28 tablet, Rfl: 0 .  glimepiride (AMARYL) 4 MG tablet, take 1 tablet once daily, Disp: 30 tablet, Rfl: 3 .  latanoprost (XALATAN) 0.005 % ophthalmic solution, XALATAN, 0.005% (Ophthalmic Solution)  for 0 days  Quantity: 0.00;  Refills: 0   Ordered :11-Feb-2010  Mila MerryFisher, Donald MD;  Started 04-May-2006 Active, Disp: , Rfl:  .  lisinopril-hydrochlorothiazide (PRINZIDE,ZESTORETIC) 20-25 MG tablet, take 1 tablet by mouth once daily, Disp: 30 tablet, Rfl: 1 .  metFORMIN (GLUCOPHAGE) 1000 MG tablet, take 1 tablet by mouth twice a day with food, Disp: 60 tablet, Rfl: 0  Review of Systems  Constitutional: Negative for appetite change, chills and fever.  Respiratory: Negative for chest tightness, shortness of breath and wheezing.   Cardiovascular: Negative for chest pain and palpitations.  Gastrointestinal: Negative for abdominal pain, nausea and vomiting.  Endocrine: Negative for polydipsia and polyphagia.  Neurological: Positive for weakness.    Social History  Substance Use  Topics  . Smoking status: Current Some Day Smoker    Years: 23.00    Types: Cigars  . Smokeless tobacco: Not on file     Comment: smokes 3 cigars a week.   . Alcohol use 0.0 oz/week     Comment: occasionally    Objective:   BP 120/70 (BP Location: Left Arm, Patient Position: Sitting, Cuff Size: Large)   Pulse 94   Temp 98.9 F (37.2 C) (Oral)   Resp 16   Wt (!) 362 lb (164.2 kg)   SpO2 97% Comment: room air  BMI 44.06 kg/m  There were no vitals filed for this visit.   Physical Exam  General Appearance:    Alert, cooperative, no distress, obese  Eyes:    PERRL, conjunctiva/corneas clear, EOM's intact       Lungs:     Clear to auscultation bilaterally, respirations unlabored  Heart:    Regular rate and rhythm  Neurologic:   Awake, alert, oriented x 3. No apparent focal neurological           defect.       Results for orders placed or performed in visit on 11/23/16  POCT HgB A1C  Result Value Ref Range   Hemoglobin A1C 10.8    Est. average glucose Bld gHb Est-mCnc 263         Assessment & Plan:     1. Diabetes mellitus type 2 with neurological manifestations (HCC) Improved on Farxiga although not taking consistent due to lack of insurance. He has checked into medications assistance but apparently does not qualify. Given 6 weeks samples of Farxiga today.  - POCT HgB A1C - Lipid panel - Renal function panel  2. Type 2 diabetes mellitus with hyperglycemia, without long-term current use of insulin (HCC)  Advised to start daily 81mg  ECASA, Recommend he start OTC B12 due to neuropathy and given information regarding neuropathy and foot exam.   Return in about 4 months (around 03/26/2017).        Mila Merry, MD  [facility]

## 2016-12-08 ENCOUNTER — Other Ambulatory Visit: Payer: Self-pay | Admitting: Family Medicine

## 2016-12-22 ENCOUNTER — Telehealth: Payer: Self-pay | Admitting: *Deleted

## 2016-12-22 NOTE — Telephone Encounter (Signed)
Patient was notified. Patient stated that he will be in 12/28/2016. Lab slip and account bill at front desk.

## 2016-12-22 NOTE — Telephone Encounter (Signed)
-----   Message from Malva Limes, MD sent at 12/20/2016 10:09 AM EDT ----- Regarding: FW: needs renal and lipids checked before mid august Please advise patient is time to check renal panel and lipids. Please print order for him to pick up labs slip. Needs to be ordered as "Account bill"   ----- Message ----- From: Malva Limes, MD Sent: 12/14/2016 To: Malva Limes, MD Subject: needs renal and lipids checked before mid au#

## 2017-01-01 DIAGNOSIS — Z0000 Encounter for general adult medical examination without abnormal findings: Secondary | ICD-10-CM

## 2017-01-02 LAB — RENAL FUNCTION PANEL
Albumin: 4 g/dL (ref 3.5–5.5)
BUN/Creatinine Ratio: 13 (ref 9–20)
BUN: 14 mg/dL (ref 6–24)
CO2: 25 mmol/L (ref 20–29)
Calcium: 9.6 mg/dL (ref 8.7–10.2)
Chloride: 96 mmol/L (ref 96–106)
Creatinine, Ser: 1.08 mg/dL (ref 0.76–1.27)
GFR calc Af Amer: 89 mL/min/1.73
GFR calc non Af Amer: 77 mL/min/1.73
Glucose: 208 mg/dL — ABNORMAL HIGH (ref 65–99)
Phosphorus: 4.2 mg/dL (ref 2.5–4.5)
Potassium: 4.3 mmol/L (ref 3.5–5.2)
Sodium: 137 mmol/L (ref 134–144)

## 2017-01-02 LAB — LIPID PANEL
Chol/HDL Ratio: 5.1 ratio — ABNORMAL HIGH (ref 0.0–5.0)
Cholesterol, Total: 211 mg/dL — ABNORMAL HIGH (ref 100–199)
HDL: 41 mg/dL
LDL Calculated: 125 mg/dL — ABNORMAL HIGH (ref 0–99)
Triglycerides: 223 mg/dL — ABNORMAL HIGH (ref 0–149)
VLDL Cholesterol Cal: 45 mg/dL — ABNORMAL HIGH (ref 5–40)

## 2017-01-05 ENCOUNTER — Telehealth: Payer: Self-pay

## 2017-01-05 MED ORDER — LOVASTATIN 20 MG PO TABS: 20.0000 mg | ORAL_TABLET | Freq: Every day | ORAL | 5 refills | Status: DC

## 2017-01-05 NOTE — Telephone Encounter (Signed)
Advised patient as below. Lovastatin was sent into the pharmacy listed.

## 2017-01-05 NOTE — Telephone Encounter (Signed)
No, eating does affect triglycerides, but not cholesterol. He needs to start lovastatin.

## 2017-01-05 NOTE — Telephone Encounter (Signed)
Advised patient as below. Patient mentions that he was not fasting during the time of his blood draw. He reports that he had a large breakfast before his OV. He wanted to know if it is possible if he could have his Lipid panel repeated? He thinks that this may have effected his results. Please advise. Samples of Marcelline DeistFarxiga was left up front for the patient to pick up. Thanks!

## 2017-01-05 NOTE — Telephone Encounter (Signed)
-----   Message from Malva Limesonald E Fisher, MD sent at 01/02/2017  8:36 AM EDT ----- Cholesterol is much too high. Consider he is diabetic with high cholesterol he is at high risk for developing heart disease, and needs to start cholesterol medication for prevention. Start lovastatin 20mg  once a day, #30. rf x 5. Continue Farxiga 10mg , can have a months samples if he is low. Should also be taking low dose 81mg  aspirin once a day. Follow up 2 months for diabetes.

## 2017-01-10 ENCOUNTER — Other Ambulatory Visit: Payer: Self-pay | Admitting: Family Medicine

## 2017-01-10 DIAGNOSIS — I10 Essential (primary) hypertension: Secondary | ICD-10-CM

## 2017-02-25 ENCOUNTER — Telehealth: Payer: Self-pay | Admitting: Family Medicine

## 2017-02-25 NOTE — Telephone Encounter (Signed)
Please advise patient we have samples of Farxiga in. He can have 5 boxes of Farxiga 10mg  to get by until office visit in November.

## 2017-02-25 NOTE — Telephone Encounter (Signed)
-----   Message from Malva Limesonald E Bernisha Verma, MD sent at 02/15/2017  4:18 PM EDT ----- Regarding: FW: will be out of farxiga samples first week of october Has appt 11/26 2018  ----- Message ----- From: Malva LimesFisher, Amandajo Gonder E, MD Sent: 02/06/2017 To: Malva Limesonald E Esker Dever, MD Subject: FW: will be out of farxiga samples first wee#    ----- Message ----- From: Malva LimesFisher, Keilen Kahl E, MD Sent: 02/02/2017 To: Malva Limesonald E Mable Dara, MD Subject: FW: will be out of farxiga samples first wee#    ----- Message ----- From: Malva LimesFisher, Jenese Mischke E, MD Sent: 01/11/2017 To: Malva Limesonald E Alleyah Twombly, MD Subject: will be out of farxiga samples first week of#

## 2017-02-25 NOTE — Telephone Encounter (Signed)
Patient was notified.

## 2017-03-29 ENCOUNTER — Encounter: Payer: Self-pay | Admitting: Family Medicine

## 2017-03-29 ENCOUNTER — Ambulatory Visit: Payer: Self-pay | Admitting: Family Medicine

## 2017-03-29 ENCOUNTER — Other Ambulatory Visit: Payer: Self-pay

## 2017-03-29 ENCOUNTER — Ambulatory Visit (INDEPENDENT_AMBULATORY_CARE_PROVIDER_SITE_OTHER): Payer: Self-pay | Admitting: Family Medicine

## 2017-03-29 VITALS — BP 120/74 | HR 99 | Temp 99.0°F | Resp 16 | Ht 76.0 in | Wt 363.0 lb

## 2017-03-29 DIAGNOSIS — Z1331 Encounter for screening for depression: Secondary | ICD-10-CM

## 2017-03-29 DIAGNOSIS — E1121 Type 2 diabetes mellitus with diabetic nephropathy: Secondary | ICD-10-CM

## 2017-03-29 DIAGNOSIS — N529 Male erectile dysfunction, unspecified: Secondary | ICD-10-CM

## 2017-03-29 DIAGNOSIS — E785 Hyperlipidemia, unspecified: Secondary | ICD-10-CM

## 2017-03-29 LAB — POCT GLYCOSYLATED HEMOGLOBIN (HGB A1C)
Est. average glucose Bld gHb Est-mCnc: 217
Hemoglobin A1C: 9.2

## 2017-03-29 MED ORDER — SILDENAFIL CITRATE 20 MG PO TABS: ORAL_TABLET | ORAL | 3 refills | Status: DC

## 2017-03-29 MED ORDER — SIMVASTATIN 20 MG PO TABS: 20.0000 mg | ORAL_TABLET | Freq: Every day | ORAL | 1 refills | Status: DC

## 2017-03-29 NOTE — Progress Notes (Signed)
Patient: [name] ReamerLarry D Jensen Male    DOB: 1960/11/10   56 y.o.   MRN: [id number] Visit Date: 03/29/2017  Today's Provider: Mila Merryonald Fisher, MD   Chief Complaint  Patient presents with  . Follow-up  . Diabetes  . Hypertension  . Hyperlipidemia   Subjective:    HPI   Diabetes Mellitus Type II, Follow-up:   Lab Results  Component Value Date   HGBA1C 10.8 11/23/2016   HGBA1C >14.0 03/06/2016   HGBA1C 11.6 07/29/2015   Last seen for diabetes 4 months ago.  Management since then includes; advised to start daily aspirin. Recommended otc B12 due to neuropathy. He reports good compliance with treatment. He is not having side effects. none Current symptoms include none and have been unchanged. Home blood sugar records: fasting range: 140-150  Episodes of hypoglycemia? no   Current Insulin Regimen: n/a Most [facility] Weight trend: stable Prior visit with dietician: no Current diet: well balanced Current exercise: walking  ----------------------------------------------------------------   Hypertension, follow-up:  BP Readings from Last 3 Encounters:  03/29/17 120/74  11/23/16 120/70  03/06/16 130/80    He was last seen for hypertension 1 years ago.  BP at that visit was 130/80. Management since that visit includes; no changes.He reports good compliance with treatment. He is not having side effects. none He is exercising. He is adherent to low salt diet.   Outside blood pressures are 127/83. He is experiencing none.  Patient denies none.   Cardiovascular risk factors include diabetes mellitus.  Use of agents associated with hypertension: none.   ----------------------------------------------------------------    Lipid/Cholesterol, Follow-up:   Last seen for this 4 months ago.  Management since that visit includes; labs checked, started lovastatin 20 mg qd.  Last Lipid Panel:    Component Value Date/Time   CHOL 211 (H)  01/01/2017 1132   TRIG 223 (H) 01/01/2017 1132   HDL 41 01/01/2017 1132   CHOLHDL 5.1 (H) 01/01/2017 1132   LDLCALC 125 (H) 01/01/2017 1132    He reports good compliance with treatment. He is having side effects. Patient states the lovastatin that was prescribed made his blood pressure go up, so he discontinued the medication about 30 days ago.   Wt Readings from Last 3 Encounters:  03/29/17 (!) 363 lb (164.7 kg)  11/23/16 (!) 362 lb (164.2 kg)  03/06/16 (!) 367 lb (166.5 kg)     ----------------------------------------------------------------  Depression screen PHQ 2/9 03/29/2017  Decreased Interest 1  Down, Depressed, Hopeless 1  PHQ - 2 Score 2  Altered sleeping 1  Tired, decreased energy 1  Change in appetite 1  Feeling bad or failure about yourself  2  Trouble concentrating 1  Moving slowly or fidgety/restless 0  Suicidal thoughts 2  PHQ-9 Score 10  Difficult doing work/chores Somewhat difficult     No Known Allergies   Current Outpatient Medications:  .  amLODipine (NORVASC) 5 MG tablet, Take 1 tablet (5 mg total) by mouth daily. Reported on 07/29/2015, Disp: 30 tablet, Rfl: 2 .  brimonidine (ALPHAGAN P) 0.1 % SOLN, Place 1 drop into both eyes 2 (two) times daily., Disp: , Rfl:  .  dapagliflozin propanediol (FARXIGA) 10 MG TABS tablet, Take 1 tablet daily, Disp: 28 tablet, Rfl: 0 .  glimepiride (AMARYL) 4 MG tablet, take 1 tablet once daily, Disp: 30 tablet, Rfl: 3 .  latanoprost (XALATAN) 0.005 % ophthalmic solution, XALATAN, 0.005% (Ophthalmic Solution)  for 0 days  Quantity: 0.00;  Refills: 0   Ordered :11-Feb-2010  Mila MerryFisher, Donald MD;  Started 04-May-2006 Active, Disp: , Rfl:  .  lisinopril-hydrochlorothiazide (PRINZIDE,ZESTORETIC) 20-25 MG tablet, take 1 tablet by mouth once daily, Disp: 30 tablet, Rfl: 3 .  metFORMIN (GLUCOPHAGE) 1000 MG tablet, take 1 tablet by mouth twice a day with food, Disp: 60 tablet, Rfl: 2 .  lovastatin (MEVACOR) 20 MG tablet, Take 1  tablet (20 mg total) by mouth at bedtime. (Patient not taking: Reported on 03/29/2017), Disp: 30 tablet, Rfl: 5  Review of Systems  Constitutional: Negative for appetite change, chills and fever.  Respiratory: Negative for chest tightness, shortness of breath and wheezing.   Cardiovascular: Negative for chest pain and palpitations.  Gastrointestinal: Negative for abdominal pain, nausea and vomiting.    Social History   Tobacco Use  . Smoking status: Current Some Day Smoker    Years: 23.00    Types: Cigars  . Smokeless tobacco: Former NeurosurgeonUser  . Tobacco comment: smokes 3 cigars a week.   Substance Use Topics  . Alcohol use: Yes    Alcohol/week: 0.0 oz    Comment: occasionally    Objective:   BP 120/74 (BP Location: Right Arm, Patient Position: Sitting, Cuff Size: Large)   Pulse 99   Temp 99 F (37.2 C) (Oral)   Resp 16   Ht 6\' 4"  (1.93 m)   Wt (!) 363 lb (164.7 kg)   SpO2 98%   BMI 44.19 kg/m  Vitals:   03/29/17 1637  BP: 120/74  Pulse: 99  Resp: 16  Temp: 99 F (37.2 C)  TempSrc: Oral  SpO2: 98%  Weight: (!) 363 lb (164.7 kg)  Height: 6\' 4"  (1.93 m)     Physical Exam  General Appearance:    Alert, cooperative, no distress, morbidly obese  Eyes:    PERRL, conjunctiva/corneas clear, EOM's intact       Lungs:     Clear to auscultation bilaterally, respirations unlabored  Heart:    Regular rate and rhythm  Neurologic:   Awake, alert, oriented x 3. No apparent focal neurological           defect.       Results for orders placed or performed in visit on 03/29/17  POCT glycosylated hemoglobin (Hb A1C)  Result Value Ref Range   Hemoglobin A1C 9.2    Est. average glucose Bld gHb Est-mCnc 217        Assessment & Plan:     1. Diabetes mellitus with nephropathy (HCC) Uncontrolled, but improved, although only taking [place] sporadically due to cost. Given 3 sample boxes.  - POCT glycosylated hemoglobin (Hb A1C)  2. Hyperlipidemia, unspecified hyperlipidemia  type Off lovastatin due to increase in BP at home when he was taking it. Try simvastatin 20mg   3. Positive depression screening He states his mood has been a lot better since starting OTC testosterone booster. He mainly gets upset when family member nag him and feels he is handling things well at this time. States he id doing fine now and not interested in any type of intervention for depression.  Return in about 4 months (around 07/27/2017).        Mila Merryonald Fisher, MD  [facility]

## 2017-03-29 NOTE — Patient Instructions (Signed)
It is recommended to engage in 150 minutes of moderate exercise every week.   

## 2017-05-06 ENCOUNTER — Telehealth: Payer: Self-pay | Admitting: Family Medicine

## 2017-05-06 MED ORDER — DAPAGLIFLOZIN PROPANEDIOL 10 MG PO TABS: ORAL_TABLET | ORAL | 0 refills | Status: DC

## 2017-05-06 NOTE — Telephone Encounter (Signed)
Please let patient know we have some samples of Farxiga. He can have 5 boxes if he is out.

## 2017-05-06 NOTE — Telephone Encounter (Signed)
-----   Message from Malva Limesonald E Fisher, MD sent at 04/28/2017  8:59 AM EST ----- Regarding: FW: runs out of Farxiga samples 12-17   ----- Message ----- From: Malva LimesFisher, Donald E, MD Sent: 04/22/2017 To: Malva Limesonald E Fisher, MD Subject: FW: runs out of Farxiga samples 12-17            ----- Message ----- From: Malva LimesFisher, Donald E, MD Sent: 04/15/2017 To: Malva Limesonald E Fisher, MD Subject: runs out of Farxiga samples 12-17

## 2017-05-06 NOTE — Telephone Encounter (Signed)
Patient would to get the samples. Samples at front desk.

## 2017-05-06 NOTE — Addendum Note (Signed)
Addended by: Marlene LardMILLER, APRIL M on: 05/06/2017 02:56 PM   Modules accepted: Orders

## 2017-05-28 ENCOUNTER — Other Ambulatory Visit: Payer: Self-pay | Admitting: Family Medicine

## 2017-05-28 DIAGNOSIS — I10 Essential (primary) hypertension: Secondary | ICD-10-CM

## 2017-06-24 ENCOUNTER — Other Ambulatory Visit: Payer: Self-pay | Admitting: Family Medicine

## 2017-08-21 ENCOUNTER — Emergency Department
Admission: EM | Admit: 2017-08-21 | Discharge: 2017-08-23 | Disposition: A | Discharge status: Home / Self Care | Payer: Self-pay | Attending: Emergency Medicine | Admitting: Emergency Medicine

## 2017-08-21 ENCOUNTER — Other Ambulatory Visit: Payer: Self-pay

## 2017-08-21 DIAGNOSIS — E119 Type 2 diabetes mellitus without complications: Secondary | ICD-10-CM | POA: Insufficient documentation

## 2017-08-21 DIAGNOSIS — F329 Major depressive disorder, single episode, unspecified: Secondary | ICD-10-CM | POA: Insufficient documentation

## 2017-08-21 DIAGNOSIS — F17290 Nicotine dependence, other tobacco product, uncomplicated: Secondary | ICD-10-CM | POA: Insufficient documentation

## 2017-08-21 DIAGNOSIS — R45851 Suicidal ideations: Secondary | ICD-10-CM | POA: Insufficient documentation

## 2017-08-21 DIAGNOSIS — Z79899 Other long term (current) drug therapy: Secondary | ICD-10-CM | POA: Insufficient documentation

## 2017-08-21 DIAGNOSIS — Y939 Activity, unspecified: Secondary | ICD-10-CM | POA: Insufficient documentation

## 2017-08-21 DIAGNOSIS — Y999 Unspecified external cause status: Secondary | ICD-10-CM | POA: Insufficient documentation

## 2017-08-21 DIAGNOSIS — Z7984 Long term (current) use of oral hypoglycemic drugs: Secondary | ICD-10-CM | POA: Insufficient documentation

## 2017-08-21 DIAGNOSIS — F10929 Alcohol use, unspecified with intoxication, unspecified: Secondary | ICD-10-CM | POA: Insufficient documentation

## 2017-08-21 DIAGNOSIS — I10 Essential (primary) hypertension: Secondary | ICD-10-CM | POA: Insufficient documentation

## 2017-08-21 DIAGNOSIS — S61519A Laceration without foreign body of unspecified wrist, initial encounter: Secondary | ICD-10-CM

## 2017-08-21 DIAGNOSIS — Y906 Blood alcohol level of 120-199 mg/100 ml: Secondary | ICD-10-CM | POA: Insufficient documentation

## 2017-08-21 DIAGNOSIS — F1994 Other psychoactive substance use, unspecified with psychoactive substance-induced mood disorder: Secondary | ICD-10-CM

## 2017-08-21 DIAGNOSIS — F32A Depression, unspecified: Secondary | ICD-10-CM

## 2017-08-21 DIAGNOSIS — F1010 Alcohol abuse, uncomplicated: Secondary | ICD-10-CM

## 2017-08-21 DIAGNOSIS — X781XXA Intentional self-harm by knife, initial encounter: Secondary | ICD-10-CM | POA: Insufficient documentation

## 2017-08-21 DIAGNOSIS — X789XXA Intentional self-harm by unspecified sharp object, initial encounter: Secondary | ICD-10-CM

## 2017-08-21 DIAGNOSIS — Y929 Unspecified place or not applicable: Secondary | ICD-10-CM | POA: Insufficient documentation

## 2017-08-21 LAB — CBC
HCT: 50.3 % (ref 40.0–52.0)
HEMOGLOBIN: 16.5 g/dL (ref 13.0–18.0)
MCH: 27 pg (ref 26.0–34.0)
MCHC: 32.8 g/dL (ref 32.0–36.0)
MCV: 82.1 fL (ref 80.0–100.0)
PLATELETS: 244 10*3/uL (ref 150–440)
RBC: 6.12 MIL/uL — AB (ref 4.40–5.90)
RDW: 15.1 % — ABNORMAL HIGH (ref 11.5–14.5)
WBC: 10 10*3/uL (ref 3.8–10.6)

## 2017-08-21 LAB — URINE DRUG SCREEN, QUALITATIVE (ARMC ONLY)
AMPHETAMINES, UR SCREEN: NOT DETECTED
Barbiturates, Ur Screen: NOT DETECTED
Benzodiazepine, Ur Scrn: NOT DETECTED
CANNABINOID 50 NG, UR ~~LOC~~: NOT DETECTED
COCAINE METABOLITE, UR ~~LOC~~: NOT DETECTED
MDMA (ECSTASY) UR SCREEN: NOT DETECTED
METHADONE SCREEN, URINE: NOT DETECTED
Opiate, Ur Screen: NOT DETECTED
Phencyclidine (PCP) Ur S: NOT DETECTED
TRICYCLIC, UR SCREEN: NOT DETECTED

## 2017-08-21 LAB — SALICYLATE LEVEL: Salicylate Lvl: <7 mg/dL (ref 2.8–30.0)

## 2017-08-21 LAB — GLUCOSE, CAPILLARY
GLUCOSE-CAPILLARY: 219 mg/dL — AB (ref 65–99)
GLUCOSE-CAPILLARY: 155 mg/dL — AB (ref 65–99)
Glucose-Capillary: 224 mg/dL — ABNORMAL HIGH (ref 65–99)

## 2017-08-21 LAB — COMPREHENSIVE METABOLIC PANEL
ALT: 14 U/L — ABNORMAL LOW (ref 17–63)
AST: 19 U/L (ref 15–41)
Albumin: 3.4 g/dL — ABNORMAL LOW (ref 3.5–5.0)
Alkaline Phosphatase: 49 U/L (ref 38–126)
Anion gap: 11 (ref 5–15)
BUN: 17 mg/dL (ref 6–20)
CO2: 27 mmol/L (ref 22–32)
Calcium: 8.5 mg/dL — ABNORMAL LOW (ref 8.9–10.3)
Chloride: 98 mmol/L — ABNORMAL LOW (ref 101–111)
Creatinine, Ser: 1.06 mg/dL (ref 0.61–1.24)
GFR calc Af Amer: >60 mL/min (ref >60)
GFR calc non Af Amer: >60 mL/min (ref >60)
Glucose, Bld: 266 mg/dL — ABNORMAL HIGH (ref 65–99)
Potassium: 3.6 mmol/L (ref 3.5–5.1)
Sodium: 136 mmol/L (ref 135–145)
Total Bilirubin: 0.3 mg/dL (ref 0.3–1.2)
Total Protein: 6.7 g/dL (ref 6.5–8.1)

## 2017-08-21 LAB — ETHANOL: ALCOHOL ETHYL (B): 161 mg/dL — AB (ref <10)

## 2017-08-21 LAB — ACETAMINOPHEN LEVEL

## 2017-08-21 MED ORDER — INSULIN ASPART 100 UNIT/ML ~~LOC~~ SOLN: 0.0000 [IU] | Freq: Every day | SUBCUTANEOUS | Status: DC

## 2017-08-21 MED ORDER — TIMOLOL MALEATE 0.5 % OP SOLN
1.0000 [drp] | Freq: Two times a day (BID) | OPHTHALMIC | Status: DC
  Administered 2017-08-21 – 2017-08-23 (×4): 1 [drp] via OPHTHALMIC
  Filled 2017-08-21: qty 5

## 2017-08-21 MED ORDER — INSULIN ASPART 100 UNIT/ML ~~LOC~~ SOLN
0.0000 [IU] | Freq: Three times a day (TID) | SUBCUTANEOUS | Status: DC
  Filled 2017-08-21: qty 1

## 2017-08-21 MED ORDER — LISINOPRIL-HYDROCHLOROTHIAZIDE 20-25 MG PO TABS: 1.0000 | ORAL_TABLET | Freq: Every day | ORAL | Status: DC

## 2017-08-21 MED ORDER — METFORMIN HCL 500 MG PO TABS
1000.0000 mg | ORAL_TABLET | Freq: Two times a day (BID) | ORAL | Status: DC
  Administered 2017-08-21 – 2017-08-23 (×4): 1000 mg via ORAL
  Filled 2017-08-21 (×4): qty 2

## 2017-08-21 MED ORDER — LATANOPROST 0.005 % OP SOLN
1.0000 [drp] | Freq: Every day | OPHTHALMIC | Status: DC
  Administered 2017-08-21 – 2017-08-22 (×2): 1 [drp] via OPHTHALMIC
  Filled 2017-08-21 (×2): qty 2.5

## 2017-08-21 MED ORDER — HYDROCHLOROTHIAZIDE 25 MG PO TABS
25.0000 mg | ORAL_TABLET | Freq: Every day | ORAL | Status: DC
  Administered 2017-08-22 – 2017-08-23 (×2): 25 mg via ORAL
  Filled 2017-08-21 (×2): qty 1

## 2017-08-21 MED ORDER — CANAGLIFLOZIN 100 MG PO TABS: 100.0000 mg | ORAL_TABLET | Freq: Every day | ORAL | Status: DC

## 2017-08-21 MED ORDER — GLIMEPIRIDE 2 MG PO TABS
4.0000 mg | ORAL_TABLET | Freq: Every day | ORAL | Status: DC
  Administered 2017-08-22 – 2017-08-23 (×2): 4 mg via ORAL
  Filled 2017-08-21: qty 2
  Filled 2017-08-21: qty 1
  Filled 2017-08-21: qty 2

## 2017-08-21 MED ORDER — LISINOPRIL 20 MG PO TABS
20.0000 mg | ORAL_TABLET | Freq: Every day | ORAL | Status: DC
  Administered 2017-08-22 – 2017-08-23 (×2): 20 mg via ORAL
  Filled 2017-08-21 (×2): qty 1

## 2017-08-21 MED ORDER — ESCITALOPRAM OXALATE 10 MG PO TABS
10.0000 mg | ORAL_TABLET | Freq: Every day | ORAL | Status: DC
  Administered 2017-08-21 – 2017-08-23 (×3): 10 mg via ORAL
  Filled 2017-08-21 (×3): qty 1

## 2017-08-21 NOTE — ED Notes (Signed)
Wynelle FannyKevin Shipmon, [phone], cell phone, friend to pt, brought to room to garner cooperation from pt, 5 min visit supervised by [employer]  Per Caryn BeeKevin: pt has had long term marital strife with wife, that culminated tonight with the pt calling Caryn BeeKevin and reporting "I'm going to go in to the bar, have a drink, and kill myself", Caryn BeeKevin alerted the police that brought pt to ED, pt "has had poor self esteem since being unable to work"

## 2017-08-21 NOTE — ED Notes (Signed)
Pt quiet and tearful upon arrival to unit. No needs or concerns at this time. Maintained on 15 minute checks and observation by security camera for safety.

## 2017-08-21 NOTE — ED Notes (Signed)
Pt's dad: home - [phone], cell - [phone]  1st Cousin (pt's trusts him a lot) Clifton CustardAaron, [phone]

## 2017-08-21 NOTE — ED Notes (Signed)
SOC machine set up in pt room at this time.

## 2017-08-21 NOTE — ED Notes (Addendum)
ED Provider at bedside.  Wounds cleaned, "steri-strip" closed, and dressed

## 2017-08-21 NOTE — ED Provider Notes (Signed)
[facility] Emergency Department Provider Note  ____________________________________________   First MD Initiated Contact with Patient 08/21/17 [phone]     (approximate)  I have reviewed the triage vital signs and the nursing notes.   HISTORY  Chief Complaint Suicidal  Level 5 caveat:  history/ROS limited by acute intoxication  HPI [name] Jensen is a 57 y.o. male who is brought in under involuntary commitment for suicidal ideation, expressing a desire to hurt his wife, and for engaging in self-harm by lacerating bilateral wrists.  He was not entirely forthcoming about the issues except saying that he is having a lot of problems in life and with his family and he wants to end it all.  He is very adamant that he wants to die and if he is allowed to go home he will kill himself.  He also states that he wants to punch his wife in the face although he does not necessarily want to kill her.  He did make comment that he should never have married her.  He states that the decision he made to cut his wrist tonight was spontaneous and he does admit to drinking some alcohol tonight.  He cut on both of his wrist with a paring knife and has relatively superficial lacerations.  He is very communicative and forthcoming with all of these symptoms and issues.  He denies any other medical issues at this time as documented in the review of systems below.  His psychiatric issues and suicidal ideation are severe and nothing in particular is making them better or worse.  It sounds like most of this is been present for a while but the  severity and acute onset was relatively acute.  Past Medical History:  Diagnosis Date  . Diabetes mellitus without complication (HCC)   . Glaucoma   . History of chicken pox   . History of measles   . History of mumps   . Hyperlipidemia   . Hypertension   . Neuropathy     Patient Active Problem List   Diagnosis Date Noted  . Glaucoma 06/05/2015  .  Neuropathy 06/05/2015  . Essential hypertension 02/01/2015  . Osteoarthrosis 10/19/2008  . Arthralgia of lower leg 10/18/2008  . Atypical chest pain 05/22/2008  . Insomnia 05/14/2008  . HLD (hyperlipidemia) 03/05/2007  . 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 Diabetes mellitus with nephropathy (HCC) 05/04/1998    Past Surgical History:  Procedure Laterality Date  . CYSTECTOMY  2003   total: Pilonidal  . VASECTOMY  2003    Prior to Admission medications   Medication Sig Start Date End Date Taking? Authorizing Provider  amLODipine (NORVASC) 5 MG tablet Take 1 tablet (5 mg total) by mouth daily. Reported on 07/29/2015 03/06/16   Malva LimesFisher, Donald E, MD  brimonidine (ALPHAGAN P) 0.1 % SOLN Place 1 drop into both eyes 2 (two) times daily. 06/06/07   [provider]  cephALEXin (KEFLEX) 500 MG capsule take 1 capsule by mouth four times a day 06/24/17   Malva LimesFisher, Donald E, MD  dapagliflozin propanediol (FARXIGA) 10 MG TABS tablet Take 1 tablet daily 05/06/17   Malva LimesFisher, Donald E, MD  glimepiride (AMARYL) 4 MG tablet take 1 tablet by mouth once daily 05/28/17   Malva LimesFisher, Donald E, MD  latanoprost (XALATAN) 0.005 % ophthalmic solution XALATAN, 0.005% (Ophthalmic Solution)  for 0 days  Quantity: 0.00;  Refills: 0   Ordered :11-Feb-2010  Mila MerryFisher, Donald MD;  Started 04-May-2006  Active 05/04/06   [provider]  lisinopril-hydrochlorothiazide (PRINZIDE,ZESTORETIC) 20-25 MG tablet take 1 tablet by mouth once daily 05/28/17   Malva Limes, MD  metFORMIN (GLUCOPHAGE) 1000 MG tablet take 1 tablet by mouth twice a day with food 12/08/16   Malva Limes, MD  sildenafil (REVATIO) 20 MG tablet 3-5 tablets as needed prior to intercourse, not to exceed 5 tablets in a day 03/29/17   Malva Limes, MD  simvastatin (ZOCOR) 20 MG tablet Take 1 tablet (20 mg total) by mouth at bedtime. 03/29/17   Malva Limes, MD    Allergies Patient has no known  allergies.  Family History  Problem Relation Age of Onset  . Hypertension Father   . Melanoma Father   . Glaucoma Father   . Osteoporosis Mother   . Hiatal hernia Mother   . Hypertension Sister     Social History Social History   Tobacco Use  . Smoking status: Current Some Day Smoker    Years: 23.00    Types: Cigars  . Smokeless tobacco: Former Neurosurgeon  . Tobacco comment: smokes 3 cigars a week.   Substance Use Topics  . Alcohol use: Yes    Alcohol/week: 0.0 oz    Comment: occasionally   . Drug use: No    Review of Systems Constitutional: No fever/chills Eyes: No visual changes. ENT: No sore throat. Cardiovascular: Denies chest pain. Respiratory: Denies shortness of breath. Gastrointestinal: No abdominal pain.  No nausea, no vomiting.  No diarrhea.  No constipation. Genitourinary: Negative for dysuria. Musculoskeletal: Negative for neck pain.  Negative for back pain. Integumentary: Negative for rash.  Self-inflicted lacerations to bilateral wrists. Neurological: Negative for headaches, focal weakness or numbness. Psych: Depression, suicidal ideation, thoughts of harming his wife  ____________________________________________   PHYSICAL EXAM:  VITAL SIGNS: ED Triage Vitals  Enc Vitals Group     BP 08/21/17 0039 140/79     Pulse Rate 08/21/17 0039 (!) 120     Resp 08/21/17 0039 20     Temp 08/21/17 0057 98.1 F (36.7 C)     Temp Source 08/21/17 0057 Oral     SpO2 08/21/17 0039 97 %     Weight 08/21/17 0057 (!) 165.6 kg (365 lb)     Height 08/21/17 0057 1.905 m (6\' 3" )     Head Circumference --      Peak Flow --      Pain Score 08/21/17 0057 0     Pain Loc --      Pain Edu? --      Excl. in GC? --     Constitutional: Alert and oriented. Well appearing and in no acute distress. Eyes: Conjunctivae are normal.  Head: Atraumatic. Nose: No congestion/rhinnorhea. Mouth/Throat: Mucous membranes are moist. Neck: No stridor.  No meningeal signs.    Cardiovascular: Normal rate, regular rhythm. Good peripheral circulation. Grossly normal heart sounds. Respiratory: Normal respiratory effort.  No retractions. Lungs CTAB. Gastrointestinal: Soft and nontender. No distention.  Musculoskeletal: No lower extremity tenderness nor edema. No gross deformities of extremities.  No decreased range of motion of either hand or wrist in spite of the lacerations.  Neurologic:  Normal speech and language. No gross focal neurologic deficits are appreciated.  Skin: The patient has a laceration to each wrist that is approximately 3 cm in length but very superficial and well aligned.  The left wrist is continuing to use a small amount of blood but there is no indication of blood  vessel involvement or ligament or tendon involvement. Psychiatric: The patient is calm and cooperative but is very matter-of-fact about indicating his suicidality, desire to hurt himself further, and desire to hurt his wife.  ____________________________________________   LABS (all labs ordered are listed, but only abnormal results are displayed)  Labs Reviewed  COMPREHENSIVE METABOLIC PANEL - Abnormal; Notable for the following components:      Result Value   Chloride 98 (*)    Glucose, Bld 266 (*)    Calcium 8.5 (*)    Albumin 3.4 (*)    ALT 14 (*)    All other components within normal limits  ETHANOL - Abnormal; Notable for the following components:   Alcohol, Ethyl (B) 161 (*)    All other components within normal limits  CBC - Abnormal; Notable for the following components:   RBC 6.12 (*)    RDW 15.1 (*)    All other components within normal limits  ACETAMINOPHEN LEVEL - Abnormal; Notable for the following components:   Acetaminophen (Tylenol), Serum <10 (*)    All other components within normal limits  SALICYLATE LEVEL  URINE DRUG SCREEN, QUALITATIVE ([facility] ONLY)   ____________________________________________  EKG  None - EKG not ordered by ED  physician ____________________________________________  RADIOLOGY  ED MD interpretation: No indication for imaging  Official radiology report(s): No results found.  ____________________________________________   PROCEDURES  Critical Care performed: No   Procedure(s) performed:   Marland KitchenMarland KitchenLaceration Repair Date/Time: 08/21/2017 4:41 AM Performed by: Loleta Rose, MD Authorized by: Loleta Rose, MD   Consent:    Consent obtained:  Verbal   Consent given by:  Patient Anesthesia (see MAR for exact dosages):    Anesthesia method:  None Laceration details:    Location:  Shoulder/arm   Shoulder/arm location:  L lower arm (wrist)   Length (cm):  3 Repair type:    Repair type:  Simple Exploration:    Contaminated: no   Treatment:    Amount of cleaning:  Standard   Irrigation solution:  Sterile saline   Visualized foreign bodies/material removed: no   Skin repair:    Repair method:  Tissue adhesive and Steri-Strips Approximation:    Approximation:  Close Post-procedure details:    Dressing:  Open (no dressing)   Patient tolerance of procedure:  Tolerated well, no immediate complications .Marland KitchenLaceration Repair Date/Time: 08/21/2017 4:42 AM Performed by: Loleta Rose, MD Authorized by: Loleta Rose, MD   Consent:    Consent obtained:  Verbal   Consent given by:  Patient Anesthesia (see MAR for exact dosages):    Anesthesia method:  None Laceration details:    Location:  Shoulder/arm   Shoulder/arm location:  R lower arm (wrist)   Length (cm):  3 Repair type:    Repair type:  Simple Exploration:    Contaminated: no   Treatment:    Amount of cleaning:  Standard   Irrigation solution:  Sterile saline   Visualized foreign bodies/material removed: no   Skin repair:    Repair method:  Tissue adhesive and Steri-Strips Approximation:    Approximation:  Close Post-procedure details:    Dressing:  Open (no dressing)   Patient tolerance of procedure:  Tolerated well, no  immediate complications     ____________________________________________   INITIAL IMPRESSION / ASSESSMENT AND PLAN / ED COURSE  As part of my medical decision making, I reviewed the following data within the electronic MEDICAL RECORD NUMBER Nursing notes reviewed and incorporated and Labs reviewed  Differential diagnosis includes, but is not limited to, substance-induced mood disorder, depression, true suicidal ideation.  His lacerations were easily repaired with Dermabond and Steri-Strips.  He is going to need psychiatric evaluation.  I suspect he may have a different outlook on things once he is sober.  I have upheld his involuntary commitment and ordered psychiatric consultation.     ____________________________________________  FINAL CLINICAL IMPRESSION(S) / ED DIAGNOSES  Final diagnoses:  Self-inflicted laceration of wrist, unspecified laterality, initial encounter  Suicidal ideation  Depression, unspecified depression type  Alcoholic intoxication with complication (HCC)     MEDICATIONS GIVEN DURING THIS VISIT:  Medications - No data to display   ED Discharge Orders    None       Note:  This document was prepared using Dragon voice recognition software and may include unintentional dictation errors.    Loleta Rose, MD 08/21/17 [phone]

## 2017-08-21 NOTE — ED Notes (Signed)
SOC recommendations:  1. Admit to inpatient psychiatry on IVC 2. Safety sitter 3. Start CIWA protocol 4. Start lexapro 10mg  PO Q daily 5. Treatment team to adjust his medications

## 2017-08-21 NOTE — ED Notes (Signed)
PT IVC/SOC COMPLETE/RECOMMENDS PLACEMENT/MOVED TO [facility] 4. 

## 2017-08-21 NOTE — ED Notes (Signed)
Patient's PM CBG=224; Patient refused Novalog=2U; ER-MD made aware.

## 2017-08-21 NOTE — ED Notes (Addendum)
Lights dimmed for pt; pt reports "I just want to go home"  Pt denies urge to urinate

## 2017-08-21 NOTE — ED Notes (Signed)
This nurse talked to patient in his room.  Pt. Appeared to be in a good mood.  Pt. Had no complaints but for the uncomfortable mattress in room.

## 2017-08-21 NOTE — BH Assessment (Signed)
Assessment Note  [name] Jensen is an 58 y.o. male brought into ED via [employer] due to self inflicted cuts on wrists. Pt admitted to drinking alcohol last night and got into verbal altercation with wife. Pt reports to having a substantial amount of marital conflict and often feels inadequate which he feels leads him to drink alcohol. Pt reports, "My wife turned my 12 year old daughter against me. And she called my parents and started telling them different things. She accused me of something. My mother took her side." Pt reports to thinking about killing himself prior to this situation but feels last night was his "breaking point". Pt stated, "I said to hell with it. It's probably better if I'm not here." Pt reports that he then proceeded to grab a pairing knife and slit his wrist. Pt unable to verify who called the police, but stated he was at the "clubhouse" when police arrived. Pt did report to calling close friend, [name] Jensen to tell him bye. Upon arrival to ED, pt was agitated and refused to speak to anyone until [name] Jensen arrived.  At time of assessment pt calm, tearful, but later agitated stating several times, "I just want to go home. 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 Pt reports concussions have been untreated, but feels they may have an impact on his decision making. Pt reports no current AH or VH or HI.   Diagnosis: Depression  Past Medical History:  Past Medical History:  Diagnosis Date  . Diabetes mellitus without complication (HCC)   . Glaucoma   . History of chicken pox   . History of measles   . History of mumps   . Hyperlipidemia   . Hypertension   . Neuropathy     Past Surgical History:  Procedure Laterality Date  . CYSTECTOMY  2003   total: Pilonidal  . VASECTOMY  2003    Family History:  Family History  Problem Relation Age of Onset  . Hypertension Father   .  Melanoma Father   . Glaucoma Father   . Osteoporosis Mother   . Hiatal hernia Mother   . Hypertension Sister     Social History:  reports that he has been smoking cigars.  He has smoked for the past 23.00 years. He has quit using smokeless tobacco. He reports that he drinks alcohol. He reports that he does not use drugs.  Additional Social History:  Alcohol / Drug Use Pain Medications: see PTA Prescriptions: see PTA Over the Counter: see PTA History of alcohol / drug use?: Yes Longest period of sobriety (when/how long): unknown Negative Consequences of Use: Personal relationships Substance #1 Name of Substance 1: alcohol 1 - Age of First Use: 24 1 - Amount (size/oz): 1 pint 1 - Frequency: weekly 1 - Duration: varies 1 - Last Use / Amount: today  CIWA: CIWA-Ar BP: 98/79 Pulse Rate: (!) 102 Nausea and Vomiting: no nausea and no vomiting Tactile Disturbances: none Tremor: no tremor Auditory Disturbances: not present Paroxysmal Sweats: no sweat visible Visual Disturbances: not present Anxiety: no anxiety, at ease Headache, Fullness in Head: none present Agitation: normal activity Orientation and Clouding of Sensorium: oriented and can do serial additions CIWA-Ar Total: 0 COWS:    Allergies: No Known Allergies  Home Medications:  (Not in a hospital admission)  OB/GYN Status:  No LMP for male patient.  General Assessment Data  [facility] Assessment: In system Is this a Tele or Face-to-Face Assessment?: Face-to-Face Is this an Initial Assessment or a Re-assessment for this encounter?: Initial Assessment Marital status: Married [name] Jensen name: N/A Is patient pregnant?: No Pregnancy Status: No Living Arrangements: Spouse/significant other, Children Can pt return to current living arrangement?: Yes(Pt states he can return, but doesnt know if he wants to) Admission Status: Involuntary Is patient capable of signing voluntary admission?: No Referral  Source: Self/Family/Friend Insurance type: None  Medical Screening Exam [facility] Walk-in ONLY) Medical Exam completed: Yes  Crisis Care Plan Living Arrangements: Spouse/significant other, Children Legal Guardian: (N/A) Name of Psychiatrist: None indicated Name of Therapist: None indicated  Education Status Is patient currently in school?: No Is the patient employed, unemployed or receiving disability?: Unemployed(Pt recently applied for disability but was denied)  Risk to self with the past 6 months Suicidal Ideation: Yes-Currently Present Has patient been a risk to self within the past 6 months prior to admission? : Yes Suicidal Intent: No-Not Currently/Within Last 6 Months Has patient had any suicidal intent within the past 6 months prior to admission? : Yes Is patient at risk for suicide?: Yes Suicidal Plan?: No-Not Currently/Within Last 6 Months Has patient had any suicidal plan within the past 6 months prior to admission? : Yes Access to Means: Yes Specify Access to Suicidal Means: Pairing knives What has been your use of drugs/alcohol within the last 12 months?: PT reports to alcohol use at least once weekly Previous Attempts/Gestures: No How many times?: 0 Other Self Harm Risks: Frequent alcohol use, pt is diabetic, hx of concussions, and has neuropathy Triggers for Past Attempts: Spouse contact Intentional Self Injurious Behavior: Cutting(Pt cut himself on wrists after altercation w/ wife) Comment - Self Injurious Behavior: PT cut himself on wrists after spouse and family altercation Family Suicide History: No Recent stressful life event(s): Conflict (Comment), Trauma (Comment)(Spousal issues ) Persecutory voices/beliefs?: No Depression: Yes Depression Symptoms: Tearfulness, Isolating, Fatigue, Guilt, Loss of interest in usual pleasures, Feeling worthless/self pity, Feeling angry/irritable Substance abuse history and/or treatment for substance abuse?: Yes Suicide prevention  information given to non-admitted patients: Yes  Risk to Others within the past 6 months Homicidal Ideation: No Does patient have any lifetime risk of violence toward others beyond the six months prior to admission? : Unknown Thoughts of Harm to Others: Yes-Currently Present Comment - Thoughts of Harm to Others: Pt reported to staff in ED that he wanted to punch wife, no HI or plan Current Homicidal Intent: No Current Homicidal Plan: No Access to Homicidal Means: Yes Describe Access to Homicidal Means: Knives within home Identified Victim: Wife History of harm to others?: No Assessment of Violence: On admission Violent Behavior Description: Pt reportedly became angry towards wife and parents. Cut himself. Agitated at admission Does patient have access to weapons?: Yes (Comment) Criminal Charges Pending?: No Does patient have a court date: No Is patient on probation?: No  Psychosis Hallucinations: None noted Delusions: None noted  Mental Status Report Appearance/Hygiene: Disheveled Eye Contact: Fair Motor Activity: Freedom of movement, Restlessness Speech: Logical/coherent Level of Consciousness: Alert, Crying, Irritable Mood: Depressed, Anxious, Ashamed/humiliated, Irritable, Helpless, Fearful, Sad, Worthless, low self-esteem Affect: Anxious, Depressed, Irritable, Sad, Preoccupied Anxiety Level: Moderate Thought Processes: Relevant Judgement: Partial Orientation: Appropriate for developmental age Obsessive Compulsive Thoughts/Behaviors: None  Cognitive Functioning Concentration: Decreased Memory: Recent Impaired Is patient IDD: No Is patient DD?: Unknown Insight: Fair Impulse Control: Fair Appetite: Good Have you had any weight changes? :  Loss Amount of the weight change? (lbs): (unknown) Sleep: Decreased Total Hours of Sleep: 3 Vegetative Symptoms: Staying in bed  ADLScreening [facility] Assessment Services) Patient's cognitive ability adequate to safely complete daily  activities?: Yes Patient able to express need for assistance with ADLs?: Yes Independently performs ADLs?: Yes (appropriate for developmental age)  Prior Inpatient Therapy Prior Inpatient Therapy: No  Prior Outpatient Therapy Prior Outpatient Therapy: No Does patient have an ACCT team?: No Does patient have Intensive In-House Services?  : No Does patient have [facility] services? : No Does patient have P4CC services?: No  ADL Screening (condition at time of admission) Patient's cognitive ability adequate to safely complete daily activities?: Yes Is the patient deaf or have difficulty hearing?: No Does the patient have difficulty seeing, even when wearing glasses/contacts?: No Does the patient have difficulty concentrating, remembering, or making decisions?: Yes Patient able to express need for assistance with ADLs?: Yes Does the patient have difficulty dressing or bathing?: No Independently performs ADLs?: Yes (appropriate for developmental age) Does the patient have difficulty walking or climbing stairs?: Yes Weakness of Legs: Both Weakness of Arms/Hands: Both  Home Assistive Devices/Equipment Home Assistive Devices/Equipment: None  Therapy Consults (therapy consults require a physician order) PT Evaluation Needed: No OT Evalulation Needed: No SLP Evaluation Needed: No Abuse/Neglect Assessment (Assessment to be complete while patient is alone) Abuse/Neglect Assessment Can Be Completed: Yes Physical Abuse: Denies Verbal Abuse: Denies Sexual Abuse: Denies Exploitation of patient/patient's resources: Denies Self-Neglect: Denies Values / Beliefs Cultural Requests During Hospitalization: None Spiritual Requests During Hospitalization: None Consults Spiritual Care Consult Needed: No Social Work Consult Needed: No Merchant navy officer (For Healthcare) Does Patient Have a Medical Advance Directive?: No Would patient like information on creating a medical advance directive?: No -  Patient declined    Additional Information 1:1 In Past 12 Months?: No CIRT Risk: No Elopement Risk: No Does patient have medical clearance?: Yes     Disposition:  Disposition Initial Assessment Completed for this Encounter: Yes Disposition of Patient: (Pending SOC recommendation) Patient refused recommended treatment: (Pt currently asking to leave and very adamant. May refuse) Mode of transportation if patient is discharged?: (Friend may be able to transport) Patient referred to: (Pending SOC recommendation)  On Site Evaluation by:   Reviewed with Physician:    Aubery Lapping, MS, [facility] 08/21/2017 5:28 AM

## 2017-08-21 NOTE — ED Notes (Signed)
Pt given breakfast tray

## 2017-08-21 NOTE — ED Notes (Addendum)
Pt tearful, reports stress at being unable to provide with a good job for his family, constant berating by spouse, "my wife turned my daughter and my parents against me so I just gave up. I took a paring knife to my wrists, when the cops came to get me I said, 'shoot me', I'm just done.  How much longer before I go home?"  Process explained to pt.  Pt denies previous suicide attempts, medical hx of neuropathy in legs, DM (non-insulin dependent), glaucoma, concussions (high school, and college) and HTN  Pt sounds stressed by finance (regular plasma donater), martial and family issues (pt wouldn't elaborate), and medical issues.  Pt reports long hx of independence and now is "filled with regrets".

## 2017-08-21 NOTE — ED Notes (Signed)
Patient resting quietly in room. No noted distress or abnormal behaviors noted. Will continue 15 minute checks and observation by security camera for safety. 

## 2017-08-21 NOTE — ED Notes (Signed)
Pt ambulatory with officer and [name] Jensen [facility]. Steady gait noted. Pt informed that doctor wants pt to stay and be evaluated more, pt agreeable. Calm and cooperative.

## 2017-08-21 NOTE — BH Assessment (Signed)
Patient's referral information faxed to:   [facility]    [address] [place] [address] Phone: [phone] Fax: [phone] [facility]    [address], [place] [address] Phone: [phone] Fax: [phone] [facility]    [address] [place] [address] Phone: [phone] Fax: [phone] [facility]   [address] [place] [address] Phone: [phone] Fax: [phone] [facility]    [address] [place] [address] Phone: [phone] Fax: [phone] [facility]   [address] [place] [address] Phone: [phone] Fax: [phone] [facility]   [address] [place] [address] Phone: [phone] Fax: [phone] [facility]   [address] [place] [address] Phone: [phone] Fax: [phone] [facility]    [address], [place] [address] Phone: [phone] Fax: [phone] [facility]    [address] [place] [address] Phone: [phone] Fax: [phone] [facility]    [address] [place] [address] Phone: [phone] Fax: [phone] [facility]   [address] [place] [address] Phone: [phone] Fax: [phone] [facility]    [address] [place] [address] Phone: [phone] Fax: [phone] [facility]   [address] [place]  [address] Phone: [phone] Fax: [phone] 

## 2017-08-21 NOTE — ED Notes (Signed)
Pt's sister is visiting. Patient advocate supervising visit. Maintained on 15 minute checks and observation by security camera for safety.

## 2017-08-21 NOTE — ED Triage Notes (Signed)
Pt. Here from home with suicidal thoughts.  Pt. Has lacerations to bilateral wrists, bleeding controlled at this time.

## 2017-08-22 LAB — GLUCOSE, CAPILLARY
Glucose-Capillary: 220 mg/dL — ABNORMAL HIGH (ref 65–99)
Glucose-Capillary: 148 mg/dL — ABNORMAL HIGH (ref 65–99)
Glucose-Capillary: 172 mg/dL — ABNORMAL HIGH (ref 65–99)

## 2017-08-22 NOTE — BH Assessment (Addendum)
Per Rep, patient is on waitlist at [facility].   

## 2017-08-22 NOTE — ED Notes (Signed)
Pt. Up from bed and sitting at edge of bed.  This nurse and fellow nurse retrieved pt. Another green mattress to add to first one.  Pt. appreciated 2nd mattress.

## 2017-08-22 NOTE — ED Notes (Signed)
Hourly rounding reveals patient in room. No complaints, stable, in no acute distress. Q15 minute rounds and monitoring via Security Cameras to continue. 

## 2017-08-22 NOTE — ED Notes (Addendum)
Hourly rounding reveals patient in room. No complaints, stable, in no acute distress. Q15 minute rounds and monitoring via Security Cameras to continue. 

## 2017-08-22 NOTE — ED Provider Notes (Signed)
-----------------------------------------   6:50 AM on 08/22/2017 -----------------------------------------   Blood pressure (!) 158/64, pulse 87, temperature 98.1 F (36.7 C), temperature source Oral, resp. rate 18, height 6\' 3"  (1.905 m), weight (!) 165.6 kg (365 lb), SpO2 99 %.  The patient had no acute events since last update.  Calm and cooperative at this time.  Disposition is pending Psychiatry/Behavioral Medicine team recommendations.     Irean HongSung, Oneill Bais J, MD 08/22/17 [phone]

## 2017-08-22 NOTE — ED Notes (Signed)
Pt changed his mind and wanted to visit with his father. RN explained to patient he was only allowed 1 visitor a day. Pt understanding.

## 2017-08-22 NOTE — BH Assessment (Signed)
[facility]. Writer spoke with [employer]Heidi

## 2017-08-22 NOTE — ED Notes (Signed)
Patient resting quietly in room. No noted distress or abnormal behaviors noted. Will continue 15 minute checks and observation by security camera for safety. 

## 2017-08-22 NOTE — ED Notes (Signed)
Report to include Situation, Background, Assessment, and Recommendations received from Amy B. RN. Patient alert and oriented, warm and dry, in no acute distress. Patient denies SI, HI, AVH and pain. Patient made aware of Q15 minute rounds and security cameras for their safety. Patient instructed to come to me with needs or concerns. 

## 2017-08-22 NOTE — ED Notes (Signed)
Pt is visiting with a family friend. Maintained on 15 minute checks and observation by security camera for safety.

## 2017-08-22 NOTE — ED Notes (Signed)
Hourly rounding reveals patient sleeping in room. No complaints, stable, in no acute distress. Q15 minute rounds and monitoring via Security Cameras to continue. 

## 2017-08-22 NOTE — ED Notes (Signed)
PT IVC/SOC COMPLETE/RECOMMENDS PLACEMENT/MOVED TO BHU 4.

## 2017-08-23 DIAGNOSIS — S61519A Laceration without foreign body of unspecified wrist, initial encounter: Secondary | ICD-10-CM

## 2017-08-23 DIAGNOSIS — F1010 Alcohol abuse, uncomplicated: Secondary | ICD-10-CM

## 2017-08-23 DIAGNOSIS — X789XXA Intentional self-harm by unspecified sharp object, initial encounter: Secondary | ICD-10-CM

## 2017-08-23 DIAGNOSIS — F1994 Other psychoactive substance use, unspecified with psychoactive substance-induced mood disorder: Secondary | ICD-10-CM

## 2017-08-23 LAB — GLUCOSE, CAPILLARY
Glucose-Capillary: 197 mg/dL — ABNORMAL HIGH (ref 65–99)
Glucose-Capillary: 147 mg/dL — ABNORMAL HIGH (ref 65–99)

## 2017-08-23 NOTE — ED Notes (Signed)
Hourly rounding reveals patient sleeping in room. No complaints, stable, in no acute distress. Q15 minute rounds and monitoring via Security Cameras to continue. 
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
## 2017-08-23 NOTE — Discharge Instructions (Signed)
Please return for any signs of infection i your wrist injury.  Please attempt to stop drinking.  If you have trouble you can follow-up with Alcoholics Anonymous.  Also you can follow-up with [facility] if need be.  Please return here for any further problems.

## 2017-08-23 NOTE — ED Notes (Signed)
IVC  PAPERS  RESCINDED   PER  DR CLAPACS    INFORMED  RN  WENDY 

## 2017-08-23 NOTE — ED Notes (Signed)
Patient with discharge instructions, no signs of distress, his dad is here to transport home, he voices understanding of discharge instructions, all belongings given back to Patient.

## 2017-08-23 NOTE — ED Provider Notes (Signed)
-----------------------------------------   6:50 AM on 08/23/2017 -----------------------------------------   Blood pressure (!) 166/86, pulse 89, temperature 97.9 F (36.6 C), temperature source Oral, resp. rate 20, height 6\' 3"  (1.905 m), weight (!) 165.6 kg (365 lb), SpO2 99 %.  The patient had no acute events since last update.  Calm and cooperative at this time.  Disposition is pending Psychiatry/Behavioral Medicine team recommendations.     Irean HongSung, Jade J, MD 08/23/17 [phone]

## 2017-08-23 NOTE — ED Notes (Signed)
Patient is watching tv, no signs of distress, will continue to monitor, q 15 minute checks and camera surveillance for safety.

## 2017-08-23 NOTE — ED Notes (Signed)
Nurse talked with patient and He states that he had become depressed due to his health issues and feels like He can't do the things he use to, states that he started drinking and then said some things that he did not meant, denies Si/hi or avh, patient talked about his 55 year old daughter and how much she means to him, Patient is calm and cooperative, no signs of distress, will continue to monitor, q 15 minute checks, camera surveillance in progress for safety.

## 2017-08-23 NOTE — ED Notes (Signed)
Patient taking shower.

## 2017-10-15 ENCOUNTER — Other Ambulatory Visit: Payer: Self-pay | Admitting: Family Medicine

## 2017-10-15 DIAGNOSIS — I10 Essential (primary) hypertension: Secondary | ICD-10-CM

## 2017-10-18 ENCOUNTER — Other Ambulatory Visit: Payer: Self-pay | Admitting: Family Medicine

## 2017-10-18 DIAGNOSIS — I10 Essential (primary) hypertension: Secondary | ICD-10-CM

## 2018-01-17 ENCOUNTER — Other Ambulatory Visit: Payer: Self-pay | Admitting: Family Medicine

## 2018-01-17 DIAGNOSIS — I10 Essential (primary) hypertension: Secondary | ICD-10-CM

## 2018-04-13 ENCOUNTER — Other Ambulatory Visit: Payer: Self-pay | Admitting: Family Medicine

## 2018-04-13 DIAGNOSIS — I10 Essential (primary) hypertension: Secondary | ICD-10-CM

## 2018-06-27 ENCOUNTER — Other Ambulatory Visit: Payer: Self-pay | Admitting: Family Medicine

## 2018-07-20 ENCOUNTER — Other Ambulatory Visit: Payer: Self-pay | Admitting: Family Medicine

## 2018-07-20 DIAGNOSIS — I10 Essential (primary) hypertension: Secondary | ICD-10-CM

## 2018-07-20 NOTE — Telephone Encounter (Signed)
Patient has not been seen in over a year to follow up on high blood pressure and diabetes. Have sent 30 days prescriptions, but needs office visit within the next month

## 2018-07-21 ENCOUNTER — Other Ambulatory Visit: Payer: Self-pay | Admitting: Family Medicine

## 2018-07-21 DIAGNOSIS — I10 Essential (primary) hypertension: Secondary | ICD-10-CM

## 2018-07-21 NOTE — Telephone Encounter (Signed)
Please advise pt is overdue for follow up and needs to make appt within the next month

## 2018-07-21 NOTE — Telephone Encounter (Signed)
Pt advised.  He will have to call back and schedule an appointment.  Pt states he is not working right now secondary to COVID-19.  Thanks,   -Vernona Rieger

## 2018-08-17 ENCOUNTER — Other Ambulatory Visit: Payer: Self-pay | Admitting: Family Medicine

## 2018-08-17 NOTE — Telephone Encounter (Signed)
Please advise refill? Patient was advised that he needs to schedule an ov, but he has not done that yet.

## 2018-08-24 ENCOUNTER — Other Ambulatory Visit: Payer: Self-pay | Admitting: Family Medicine

## 2018-08-24 NOTE — Telephone Encounter (Signed)
I called patient and advised him that he needs an office visit. Appointment scheduled Friday 08/26/2018 at 10am. He says he has bumps on his skin that are filled with pus. Patient says he gets these all the time.

## 2018-08-26 ENCOUNTER — Ambulatory Visit: Payer: Self-pay | Admitting: Family Medicine

## 2018-09-13 ENCOUNTER — Other Ambulatory Visit: Payer: Self-pay | Admitting: Family Medicine

## 2018-09-13 DIAGNOSIS — I10 Essential (primary) hypertension: Secondary | ICD-10-CM

## 2019-02-08 ENCOUNTER — Other Ambulatory Visit: Payer: Self-pay | Admitting: Family Medicine

## 2019-11-07 ENCOUNTER — Ambulatory Visit: Payer: Medicaid Other | Admitting: Podiatry

## 2019-11-07 ENCOUNTER — Other Ambulatory Visit: Payer: Self-pay

## 2019-11-07 DIAGNOSIS — E119 Type 2 diabetes mellitus without complications: Secondary | ICD-10-CM | POA: Diagnosis not present

## 2019-11-10 ENCOUNTER — Encounter: Payer: Self-pay | Admitting: Podiatry

## 2019-11-10 NOTE — Progress Notes (Signed)
Subjective: [name] Jensen presents today referred by [facility] for diabetic foot evaluation.  Patient relates 3 year history of diabetes.  Patient denies any history of foot wounds.  Patient denies any history of numbness, tingling, burning, pins/needles sensations.  Past Medical History:  Diagnosis Date  . Diabetes mellitus without complication (HCC)   . Glaucoma   . History of chicken pox   . History of measles   . History of mumps   . Hyperlipidemia   . Hypertension   . Neuropathy     Patient Active Problem List   Diagnosis Date Noted  . Substance induced mood disorder (HCC) 08/23/2017  . Alcohol abuse 08/23/2017  . Self-inflicted laceration of wrist (HCC) 08/23/2017  . Glaucoma 06/05/2015  . Neuropathy 06/05/2015  . Essential hypertension 02/01/2015  . Osteoarthrosis 10/19/2008  . Arthralgia of lower leg 10/18/2008  . Atypical chest pain 05/22/2008  . Insomnia 05/14/2008  . HLD (hyperlipidemia) 03/05/2007  . 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 Diabetes mellitus with nephropathy (HCC) 05/04/1998    Past Surgical History:  Procedure Laterality Date  . CYSTECTOMY  2003   total: Pilonidal  . VASECTOMY  2003    Current Outpatient Medications on File Prior to Visit  Medication Sig Dispense Refill  . amLODipine (NORVASC) 5 MG tablet Take 1 tablet (5 mg total) by mouth daily. Reported on 07/29/2015 (Patient not taking: Reported on 08/21/2017) 30 tablet 2  . brimonidine (ALPHAGAN) 0.15 % ophthalmic solution Place 1 drop into both eyes 2 (two) times daily.  0  . dapagliflozin propanediol (FARXIGA) 10 MG TABS tablet Take 1 tablet daily 35 tablet 0  . glimepiride (AMARYL) 4 MG tablet TAKE 1 TABLET BY MOUTH EVERY DAY 30 tablet 1  . latanoprost (XALATAN) 0.005 % ophthalmic solution Instill 1 drop in both eyes at bedtime.    Marland Kitchen lisinopril-hydrochlorothiazide (ZESTORETIC) 20-25 MG tablet TAKE 1 TABLET BY MOUTH EVERY  DAY 30 tablet 1  . metFORMIN (GLUCOPHAGE) 1000 MG tablet TAKE 1 TABLET BY MOUTH TWICE DAILY WITH FOOD 60 tablet 0  . sildenafil (REVATIO) 20 MG tablet 3-5 tablets as needed prior to intercourse, not to exceed 5 tablets in a day 50 tablet 3  . simvastatin (ZOCOR) 20 MG tablet Take 1 tablet (20 mg total) by mouth at bedtime. (Patient not taking: Reported on 08/21/2017) 30 tablet 1  . timolol (TIMOPTIC) 0.5 % ophthalmic solution Place 1 drop into both eyes 2 (two) times daily.  0   No current facility-administered medications on file prior to visit.     No Known Allergies  Social History   Occupational History  . Not on file  Tobacco Use  . Smoking status: Current Some Day Smoker    Years: 23.00    Types: Cigars  . Smokeless tobacco: Former Neurosurgeon  . Tobacco comment: smokes 3 cigars a week.   Substance and Sexual Activity  . Alcohol use: Yes    Alcohol/week: 0.0 standard drinks    Comment: occasionally   . Drug use: No  . Sexual activity: Not on file    Family History  Problem Relation Age of Onset  . Hypertension Father   . Melanoma Father   . Glaucoma Father   . Osteoporosis Mother   . Hiatal hernia Mother   . Hypertension Sister     Immunization History  Administered Date(s) Administered  . Tdap 02/21/2007    Review of systems: Positive Findings in bold print.  Constitutional:  chills, fatigue, fever, sweats, weight change Communication: Nurse, learning disability, sign Presenter, broadcasting, hand writing, iPad/Android device Head: headaches, head injury Eyes: changes in vision, eye pain, glaucoma, cataracts, macular degeneration, diplopia, glare,  light sensitivity, eyeglasses or contacts, blindness Ears nose mouth throat: hearing impaired, hearing aids,  ringing in ears, deaf, sign language,  vertigo, nosebleeds,  rhinitis,  cold sores, snoring, swollen glands Cardiovascular: HTN, edema, arrhythmia, pacemaker in place, defibrillator in place, chest pain/tightness, chronic  anticoagulation, blood clot, heart failure, MI Peripheral Vascular: leg cramps, varicose veins, blood clots, lymphedema, varicosities Respiratory:  asthma, difficulty breathing, denies congestion, SOB, wheezing, cough, emphysema Gastrointestinal: change in appetite or weight, abdominal pain, constipation, diarrhea, nausea, vomiting, vomiting blood, change in bowel habits, abdominal pain, jaundice, rectal bleeding, hemorrhoids, GERD Genitourinary:  nocturia,  pain on urination, polyuria,  blood in urine, Foley catheter, urinary urgency, ESRD on hemodialysis Musculoskeletal: amputation, cramping, stiff joints, painful joints, decreased joint motion, fractures, OA, gout, hemiplegia, paraplegia, uses cane, wheelchair bound, uses walker, uses rollator Skin: +changes in toenails, color change, dryness, itching, mole changes,  rash, wound(s) Neurological: headaches, numbness in feet, paresthesias in feet, burning in feet, fainting,  seizures, change in speech, migraines, memory problems/poor historian, cerebral palsy, weakness, paralysis, CVA, TIA Endocrine: diabetes, hypothyroidism, hyperthyroidism,  goiter, dry mouth, flushing, heat intolerance, cold intolerance,  excessive thirst, denies polyuria,  nocturia Hematological:  easy bleeding, excessive bleeding, easy bruising, enlarged lymph nodes, on long term blood thinner, history of past transusions Allergy/immunological:  hives, eczema, frequent infections, multiple drug allergies, seasonal allergies, transplant recipient, multiple food allergies Psychiatric:  anxiety, depression, mood disorder, suicidal ideations, hallucinations, insomnia  Objective: There were no vitals filed for this visit. Vascular Examination: Capillary refill time less than 3 x 10 digits.  Dorsalis pedis pulses palpable 2 out of 4.  Posterior tibial pulses palpable 2 out of 4.  Digital hair not present x 10 digits.  Skin temperature gradient WNL b/l.  Dermatological  Examination: Skin with normal turgor, texture and tone b/l  Toenails 1-5 b/l discolored, thick, dystrophic with subungual debris and pain with palpation to nailbeds due to thickness of nails.  Musculoskeletal: Muscle strength 5/5 to all LE muscle groups.  Neurological: Sensation intact with 10 gram monofilament.  Vibratory sensation intact.  Assessment: 1. NIDDM 2. Encounter for diabetic foot examination  Plan: 1. Discussed diabetic foot care principles. Literature dispensed on today. 2. Patient to continue soft, supportive shoe gear daily. 3. Patient to report any pedal injuries to medical professional immediately. 4. Follow up one year. 5. Patient/POA to call should there be a concern in the interim.

## 2020-03-26 ENCOUNTER — Other Ambulatory Visit: Payer: Self-pay | Admitting: Orthopedic Surgery

## 2020-03-26 ENCOUNTER — Encounter
Admission: RE | Admit: 2020-03-26 | Discharge: 2020-03-26 | Disposition: A | Discharge status: Home / Self Care | Payer: Medicaid Other | Source: Ambulatory Visit | Attending: Orthopedic Surgery | Admitting: Orthopedic Surgery

## 2020-03-26 ENCOUNTER — Other Ambulatory Visit: Payer: Self-pay

## 2020-03-26 ENCOUNTER — Encounter: Payer: Self-pay | Admitting: Orthopedic Surgery

## 2020-03-26 ENCOUNTER — Encounter: Payer: Self-pay | Admitting: Urgent Care

## 2020-03-26 DIAGNOSIS — I10 Essential (primary) hypertension: Secondary | ICD-10-CM | POA: Insufficient documentation

## 2020-03-26 DIAGNOSIS — Z01818 Encounter for other preprocedural examination: Secondary | ICD-10-CM | POA: Diagnosis not present

## 2020-03-26 HISTORY — DX: Unspecified osteoarthritis, unspecified site: M19.90

## 2020-03-26 HISTORY — DX: Anxiety disorder, unspecified: F41.9

## 2020-03-26 HISTORY — DX: Depression, unspecified: F32.A

## 2020-03-26 LAB — CBC
HCT: 49.5 % (ref 39.0–52.0)
Hemoglobin: 15.3 g/dL (ref 13.0–17.0)
MCH: 24.6 pg — ABNORMAL LOW (ref 26.0–34.0)
MCHC: 30.9 g/dL (ref 30.0–36.0)
MCV: 79.7 fL — ABNORMAL LOW (ref 80.0–100.0)
Platelets: 227 10*3/uL (ref 150–400)
RBC: 6.21 MIL/uL — ABNORMAL HIGH (ref 4.22–5.81)
RDW: 17.1 % — ABNORMAL HIGH (ref 11.5–15.5)
WBC: 5.9 10*3/uL (ref 4.0–10.5)
nRBC: 0 % (ref 0.0–0.2)

## 2020-03-26 LAB — URINALYSIS, ROUTINE W REFLEX MICROSCOPIC
Bacteria, UA: NONE SEEN
Bilirubin Urine: NEGATIVE
Glucose, UA: >=500 mg/dL — AB
Hgb urine dipstick: NEGATIVE
Ketones, ur: NEGATIVE mg/dL
Leukocytes,Ua: NEGATIVE
Nitrite: NEGATIVE
Protein, ur: NEGATIVE mg/dL
Specific Gravity, Urine: 1.02 (ref 1.005–1.030)
WBC, UA: NONE SEEN WBC/hpf (ref 0–5)
pH: 5 (ref 5.0–8.0)

## 2020-03-26 LAB — SURGICAL PCR SCREEN
MRSA, PCR: NEGATIVE
Staphylococcus aureus: NEGATIVE

## 2020-03-26 LAB — TYPE AND SCREEN
ABO/RH(D): O POS
Antibody Screen: NEGATIVE

## 2020-03-26 LAB — BASIC METABOLIC PANEL
Anion gap: 9 (ref 5–15)
BUN: 27 mg/dL — ABNORMAL HIGH (ref 6–20)
CO2: 26 mmol/L (ref 22–32)
Calcium: 8.8 mg/dL — ABNORMAL LOW (ref 8.9–10.3)
Chloride: 100 mmol/L (ref 98–111)
Creatinine, Ser: 1.25 mg/dL — ABNORMAL HIGH (ref 0.61–1.24)
GFR, Estimated: >60 mL/min (ref >60)
Glucose, Bld: 140 mg/dL — ABNORMAL HIGH (ref 70–99)
Potassium: 4.2 mmol/L (ref 3.5–5.1)
Sodium: 135 mmol/L (ref 135–145)

## 2020-03-26 NOTE — H&P (Signed)
NAME: [name] Jensen MRN:   [id number] DOB:   August 01, 1960     HISTORY AND PHYSICAL  CHIEF COMPLAINT:  Right hip pain  HISTORY:   [name] Jensen a 59 y.o. male  with right  Hip Pain Patient complains of right hip pain. Onset of the symptoms was several years ago. Inciting event: known DJD. The patient reports the hip pain is worse with weight bearing. Associated symptoms: none. Aggravating symptoms include: any weight bearing and going up and down stairs. Patient has had prior hip problems. Previous visits for this problem: yes, last seen several weeks ago by me. Evaluation to date: plain films, which were abnormal  osteoarthritis. Treatment to date: OTC analgesics, which have been somewhat effective, prescription analgesics, which have been somewhat effective, home exercise program, which has been somewhat effective and physical therapy, which has been somewhat effective.     Plan for right total hip replacement  PAST MEDICAL HISTORY:   Past Medical History:  Diagnosis Date  . Anxiety   . Arthritis    right hip  . Depression   . Diabetes mellitus without complication (HCC)   . Glaucoma   . History of chicken pox   . History of measles   . History of mumps   . Hyperlipidemia   . Hypertension   . Neuropathy     PAST SURGICAL HISTORY:   Past Surgical History:  Procedure Laterality Date  . CYSTECTOMY  2003   total: Pilonidal  . VASECTOMY  2003  . WISDOM TOOTH EXTRACTION      MEDICATIONS:  (Not in a hospital admission)   ALLERGIES:  No Known Allergies  REVIEW OF SYSTEMS:   Negative except HPI  FAMILY HISTORY:   Family History  Problem Relation Age of Onset  . Hypertension Father   . Melanoma Father   . Glaucoma Father   . Osteoporosis Mother   . Hiatal hernia Mother   . Hypertension Sister     SOCIAL HISTORY:   reports that he has been smoking cigars. He has smoked for the past 23.00 years. He has quit using smokeless tobacco. He reports current alcohol use. He  reports that he does not use drugs.  PHYSICAL EXAM:  General appearance: alert, cooperative and no distress Neck: no JVD and supple, symmetrical, trachea midline Resp: clear to auscultation bilaterally Cardio: regular rate and rhythm, S1, S2 normal, no murmur, click, rub or gallop GI: soft, non-tender; bowel sounds normal; no masses,  no organomegaly Extremities: extremities normal, atraumatic, no cyanosis or edema and Homans sign is negative, no sign of DVT Pulses: 2+ and symmetric Skin: Skin color, texture, turgor normal. No rashes or lesions Neurologic: Alert and oriented X 3, normal strength and tone. Normal symmetric reflexes. Normal coordination and gait    LABORATORY STUDIES: No results for input(s): WBC, HGB, HCT, PLT in the last 72 hours.  No results for input(s): NA, K, CL, CO2, GLUCOSE, BUN, CREATININE, CALCIUM in the last 72 hours.  STUDIES/RESULTS:  No results found.  ASSESSMENT:  End stage osteoarthritis right hip        Active Problems:   * No active hospital problems. *    PLAN:  Right Primary Total Hip   Altamese Cabal 03/26/2020. 11:33 AM

## 2020-03-26 NOTE — Patient Instructions (Addendum)
Your procedure is scheduled on: 04/08/20 - Monday Report to the Registration Desk on the 1st floor of the Medical Mall. To find out your arrival time, please call [phone] between 1PM - 3PM on: 04/05/20- Friday  REMEMBER: Instructions that are not followed completely may result in serious medical risk, up to and including death; or upon the discretion of your surgeon and anesthesiologist your surgery may need to be rescheduled.  Do not eat food after midnight the night before surgery.  No gum chewing, lozengers or hard candies.  You may however, drink CLEAR liquids up to 2 hours before you are scheduled to arrive for your surgery. Do not drink anything within 2 hours of your scheduled arrival time . Type 1 and Type 2 diabetics should only drink water.  TAKE THESE MEDICATIONS THE MORNING OF SURGERY WITH A SIP OF WATER:  - amLODipine (NORVASC) 10 MG tablet - brimonidine (ALPHAGAN) 0.15 % ophthalmic solution - timolol (TIMOPTIC) 0.5 % ophthalmic solution - traMADol (ULTRAM) 50 MG tablet  Stop Metformin on 12/04 and 12/05,  2 days prior to surgery.  Follow recommendations from Cardiologist, Pulmonologist or PCP regarding stopping Aspirin, Coumadin, Plavix, Eliquis, Pradaxa, or Pletal.  One week prior to surgery: Stop taking Mobic on 04/01/20, may resume after surgery. Stop Anti-inflammatories (NSAIDS) such as Advil, Aleve, Ibuprofen,Mobic,  Motrin, Naproxen, Naprosyn and Aspirin based products such as Excedrin, Goodys Powder, BC Powder.  Stop ANY OVER THE COUNTER supplements until after surgery. (However, you may continue taking Vitamin D, Vitamin B, and multivitamin up until the day before surgery.)  No Alcohol for 24 hours before or after surgery.  No Smoking including e-cigarettes for 24 hours prior to surgery.  No chewable tobacco products for at least 6 hours prior to surgery.  No nicotine patches on the day of surgery.  Do not use any "recreational" drugs for at least a  week prior to your surgery.  Please be advised that the combination of cocaine and anesthesia may have negative outcomes, up to and including death. If you test positive for cocaine, your surgery will be cancelled.  On the morning of surgery brush your teeth with toothpaste and water, you may rinse your mouth with mouthwash if you wish. Do not swallow any toothpaste or mouthwash.  Do not wear jewelry, make-up, hairpins, clips or nail polish.  Do not wear lotions, powders, or perfumes.   Do not shave body from the neck down 48 hours prior to surgery just in case you cut yourself which could leave a site for infection.  Also, freshly shaved skin may become irritated if using the CHG soap.  Contact lenses, hearing aids and dentures may not be worn into surgery.  Do not bring valuables to the hospital. [facility] is not responsible for any missing/lost belongings or valuables.   Use CHG Soap or wipes as directed on instruction sheet.  Notify your doctor if there is any change in your medical condition (cold, fever, infection).  Wear comfortable clothing (specific to your surgery type) to the hospital.  Plan for stool softeners for home use; pain medications have a tendency to cause constipation. You can also help prevent constipation by eating foods high in fiber such as fruits and vegetables and drinking plenty of fluids as your diet allows.  After surgery, you can help prevent lung complications by doing breathing exercises.  Take deep breaths and cough every 1-2 hours. Your doctor may order a device called an Incentive Spirometer to help you  take deep breaths. When coughing or sneezing, hold a pillow firmly against your incision with both hands. This is called "splinting." Doing this helps protect your incision. It also decreases belly discomfort.  If you are being admitted to the hospital overnight, leave your suitcase in the car. After surgery it may be brought to your room.  If  you are being discharged the day of surgery, you will not be allowed to drive home. You will need a responsible adult (18 years or older) to drive you home and stay with you that night.   If you are taking public transportation, you will need to have a responsible adult (18 years or older) with you. Please confirm with your physician that it is acceptable to use public transportation.   Please call the Pre-admissions Testing Dept. at [phone] if you have any questions about these instructions.  Visitation Policy:  Patients undergoing a surgery or procedure may have one family member or support person with them as long as that person is not COVID-19 positive or experiencing its symptoms.  That person may remain in the waiting area during the procedure.  Inpatient Visitation Update:   In an effort to ensure the safety of our team members and our patients, we are implementing a change to our visitation policy:  Effective Monday, Aug. 9, at 7 a.m., inpatients will be allowed one support person.  o The support person may change daily.  o The support person must pass our screening, gel in and out, and wear a mask at all times, including in the patient's room.  o Patients must also wear a mask when staff or their support person are in the room.  o Masking is required regardless of vaccination status.  Systemwide, no visitors 17 or younger.

## 2020-04-04 ENCOUNTER — Other Ambulatory Visit: Admission: RE | Admit: 2020-04-04 | Payer: Medicaid Other | Source: Ambulatory Visit

## 2020-04-04 NOTE — Pre-Procedure Instructions (Signed)
Contacted patient via phone to notify him that his listed upcoming surgery for 04/08/20 has been cancelled.  Patient was unaware of this and said that he will call MD office.  I then contacted Dr. Odis Luster office to determine if this is correct.  I spoke with Efraim Kaufmann, CMA for Dr. Odis Luster and she said that the surgery scheduler, Mardella Layman had cancelled this patient as his health insurance is out of network.  Melissa stated that she would get ahold of the patient to let him know what has transpired. She confirmed that as of today, he will not be having surgery on Monday.

## 2020-04-08 ENCOUNTER — Encounter: Admission: RE | Payer: Self-pay | Source: Home / Self Care

## 2020-04-08 ENCOUNTER — Ambulatory Visit: Admission: RE | Admit: 2020-04-08 | Payer: Medicaid Other | Source: Home / Self Care | Admitting: Orthopedic Surgery

## 2020-04-08 SURGERY — ARTHROPLASTY, HIP, TOTAL, ANTERIOR APPROACH
Anesthesia: Spinal | Site: Hip | Laterality: Right

## 2020-05-09 ENCOUNTER — Ambulatory Visit: Payer: Medicaid Other | Admitting: Podiatry

## 2020-11-22 ENCOUNTER — Other Ambulatory Visit: Payer: Self-pay

## 2020-11-22 ENCOUNTER — Encounter: Payer: Self-pay | Admitting: Orthopedic Surgery

## 2020-11-22 ENCOUNTER — Other Ambulatory Visit: Payer: Self-pay | Admitting: Orthopedic Surgery

## 2020-11-22 ENCOUNTER — Other Ambulatory Visit
Admission: RE | Admit: 2020-11-22 | Discharge: 2020-11-22 | Disposition: A | Discharge status: Home / Self Care | Payer: Medicaid Other | Source: Ambulatory Visit | Attending: Orthopedic Surgery | Admitting: Orthopedic Surgery

## 2020-11-22 DIAGNOSIS — E119 Type 2 diabetes mellitus without complications: Secondary | ICD-10-CM | POA: Insufficient documentation

## 2020-11-22 DIAGNOSIS — Z01818 Encounter for other preprocedural examination: Secondary | ICD-10-CM | POA: Diagnosis not present

## 2020-11-22 DIAGNOSIS — I10 Essential (primary) hypertension: Secondary | ICD-10-CM | POA: Insufficient documentation

## 2020-11-22 LAB — URINALYSIS, ROUTINE W REFLEX MICROSCOPIC
Bacteria, UA: NONE SEEN
Bilirubin Urine: NEGATIVE
Glucose, UA: >=500 mg/dL — AB
Hgb urine dipstick: NEGATIVE
Ketones, ur: NEGATIVE mg/dL
Leukocytes,Ua: NEGATIVE
Nitrite: NEGATIVE
Protein, ur: NEGATIVE mg/dL
Specific Gravity, Urine: 1.028 (ref 1.005–1.030)
pH: 5 (ref 5.0–8.0)

## 2020-11-22 LAB — CBC
HCT: 50 % (ref 39.0–52.0)
Hemoglobin: 16.2 g/dL (ref 13.0–17.0)
MCH: 27.4 pg (ref 26.0–34.0)
MCHC: 32.4 g/dL (ref 30.0–36.0)
MCV: 84.5 fL (ref 80.0–100.0)
Platelets: 176 10*3/uL (ref 150–400)
RBC: 5.92 MIL/uL — ABNORMAL HIGH (ref 4.22–5.81)
RDW: 15.7 % — ABNORMAL HIGH (ref 11.5–15.5)
WBC: 6.7 10*3/uL (ref 4.0–10.5)
nRBC: 0 % (ref 0.0–0.2)

## 2020-11-22 LAB — TYPE AND SCREEN
ABO/RH(D): O POS
Antibody Screen: NEGATIVE

## 2020-11-22 LAB — BASIC METABOLIC PANEL
Anion gap: 11 (ref 5–15)
BUN: 20 mg/dL (ref 6–20)
CO2: 25 mmol/L (ref 22–32)
Calcium: 8.8 mg/dL — ABNORMAL LOW (ref 8.9–10.3)
Chloride: 99 mmol/L (ref 98–111)
Creatinine, Ser: 1.08 mg/dL (ref 0.61–1.24)
GFR, Estimated: >60 mL/min (ref >60)
Glucose, Bld: 137 mg/dL — ABNORMAL HIGH (ref 70–99)
Potassium: 3.8 mmol/L (ref 3.5–5.1)
Sodium: 135 mmol/L (ref 135–145)

## 2020-11-22 LAB — APTT: aPTT: 26 seconds (ref 24–36)

## 2020-11-22 LAB — SURGICAL PCR SCREEN
MRSA, PCR: NEGATIVE
Staphylococcus aureus: NEGATIVE

## 2020-11-22 LAB — PROTIME-INR
INR: 1 (ref 0.8–1.2)
Prothrombin Time: 13.3 seconds (ref 11.4–15.2)

## 2020-11-22 NOTE — Patient Instructions (Addendum)
Your procedure is scheduled on:12-04-20 Wednesday Report to the Registration Desk on the 1st floor of the Medical Mall-Then proceed to the 2nd floor Surgery Desk in the Medical Mall To find out your arrival time, please call [phone] between 1PM - 3PM on:12-03-20 Tuesday  REMEMBER: Instructions that are not followed completely may result in serious medical risk, up to and including death; or upon the discretion of your surgeon and anesthesiologist your surgery may need to be rescheduled.  Do not eat food after midnight the night before surgery.  No gum chewing, lozengers or hard candies.  You may however, drink WATER up to 2 hours before you are scheduled to arrive for your surgery. Do not drink anything within 2 hours of your scheduled arrival time.  Type 1 and Type 2 diabetics should only drink water.  TAKE THESE MEDICATIONS THE MORNING OF SURGERY WITH A SIP OF WATER: -Amlodipine (Norvasc)  Stop Farxiga 3 days prior to surgery-Last dose on 12-01-31 Saturday  Stop Metformin 2 days prior to surgery-Last dose on 12-01-20 Sunday  One week prior to surgery: Stop Anti-inflammatories (NSAIDS) such as Mobic (Meloxicam), Advil, Aleve, Ibuprofen, Motrin, Naproxen, Naprosyn and Aspirin based products such as Excedrin, Goodys Powder, BC Powder. Last dose on 11-26-20. You may however, continue to take Tylenol/Tramadol if needed for pain up until the day of surgery.  Stop ANY OVER THE COUNTER supplements/vitamins 7 days prior to surgery  No Alcohol for 24 hours before or after surgery.  No Smoking including e-cigarettes for 24 hours prior to surgery.  No chewable tobacco products for at least 6 hours prior to surgery.  No nicotine patches on the day of surgery.  Do not use any "recreational" drugs for at least a week prior to your surgery.  Please be advised that the combination of cocaine and anesthesia may have negative outcomes, up to and including death. If you test positive for cocaine,  your surgery will be cancelled.  On the morning of surgery brush your teeth with toothpaste and water, you may rinse your mouth with mouthwash if you wish. Do not swallow any toothpaste or mouthwash.  Do not wear jewelry, make-up, hairpins, clips or nail polish.  Do not wear lotions, powders, or perfumes.   Do not shave body from the neck down 48 hours prior to surgery just in case you cut yourself which could leave a site for infection.  Also, freshly shaved skin may become irritated if using the CHG soap.  Contact lenses, hearing aids and dentures may not be worn into surgery.  Do not bring valuables to the hospital. [facility] is not responsible for any missing/lost belongings or valuables.   Use CHG Soap as directed on instruction sheet.  Notify your doctor if there is any change in your medical condition (cold, fever, infection).  Wear comfortable clothing (specific to your surgery type) to the hospital.  After surgery, you can help prevent lung complications by doing breathing exercises.  Take deep breaths and cough every 1-2 hours. Your doctor may order a device called an Incentive Spirometer to help you take deep breaths. When coughing or sneezing, hold a pillow firmly against your incision with both hands. This is called "splinting." Doing this helps protect your incision. It also decreases belly discomfort.  If you are being admitted to the hospital overnight, leave your suitcase in the car. After surgery it may be brought to your room.  If you are being discharged the day of surgery, you will not  be allowed to drive home. You will need a responsible adult (18 years or older) to drive you home and stay with you that night.   If you are taking public transportation, you will need to have a responsible adult (18 years or older) with you. Please confirm with your physician that it is acceptable to use public transportation.   Please call the [facility] Dept. at  [phone] if you have any questions about these instructions.  Surgery Visitation Policy:  Patients undergoing a surgery or procedure may have one family member or support person with them as long as that person is not COVID-19 positive or experiencing its symptoms.  That person may remain in the waiting area during the procedure.  Inpatient Visitation:    Visiting hours are 7 a.m. to 8 p.m. Inpatients will be allowed two visitors daily. The visitors may change each day during the patient's stay. No visitors under the age of 27. Any visitor under the age of 17 must be accompanied by an adult. The visitor must pass COVID-19 screenings, use hand sanitizer when entering and exiting the patient's room and wear a mask at all times, including in the patient's room. Patients must also wear a mask when staff or their visitor are in the room. Masking is required regardless of vaccination status.

## 2020-11-22 NOTE — H&P (Signed)
NAME: MARX DOIG MRN:   [id number] DOB:   [birth year]     HISTORY AND PHYSICAL  CHIEF COMPLAINT:  right hip pain  HISTORY:   [name] Jensen a 60 y.o. male  with right  Hip Pain Patient complains of right hip pain. Onset of the symptoms was several years ago. Inciting event: none. The patient reports the hip pain is worse with weight bearing. Associated symptoms: none. Aggravating symptoms include: any weight bearing and going up and down stairs. Patient has had prior hip problems. Previous visits for this problem: none. Evaluation to date: plain films, which were abnormal  severe osteoarthritis . Treatment to date: OTC analgesics, which have been somewhat effective, prescription analgesics, which have been somewhat effective, home exercise program, which has been somewhat effective, and physical therapy, which has been somewhat effective.  Plan for right total hip replacement  PAST MEDICAL HISTORY:   Past Medical History:  Diagnosis Date   Anxiety    Arthritis    right hip   Depression    Diabetes mellitus without complication (HCC)    Glaucoma    History of chicken pox    History of measles    History of mumps    Hyperlipidemia    Hypertension    Neuropathy     PAST SURGICAL HISTORY:   Past Surgical History:  Procedure Laterality Date   CYSTECTOMY  2003   total: Pilonidal   VASECTOMY  2003   WISDOM TOOTH EXTRACTION      MEDICATIONS:  (Not in a hospital admission)   ALLERGIES:  No Known Allergies  REVIEW OF SYSTEMS:   Negative except HPI  FAMILY HISTORY:   Family History  Problem Relation Age of Onset   Hypertension Father    Melanoma Father    Glaucoma Father    Osteoporosis Mother    Hiatal hernia Mother    Hypertension Sister     SOCIAL HISTORY:   reports that he has been smoking cigars. He has quit using smokeless tobacco. He reports current alcohol use. He reports that he does not use drugs.  PHYSICAL EXAM:  General appearance: alert, cooperative, and  no distress Neck: no JVD and supple, symmetrical, trachea midline Resp: clear to auscultation bilaterally Cardio: regular rate and rhythm, S1, S2 normal, no murmur, click, rub or gallop GI: soft, non-tender; bowel sounds normal; no masses,  no organomegaly    LABORATORY STUDIES: No results for input(s): WBC, HGB, HCT, PLT in the last 72 hours.  No results for input(s): NA, K, CL, CO2, GLUCOSE, BUN, CREATININE, CALCIUM in the last 72 hours.  STUDIES/RESULTS:  No results found.  ASSESSMENT:  End stage osteoarthritis right hip        Active Problems:   * No active hospital problems. *    PLAN:  Right Primary Total Hip   Altamese Cabal 11/22/2020. 9:24 AM

## 2020-11-23 LAB — HEMOGLOBIN A1C
Hgb A1c MFr Bld: 6.6 % — ABNORMAL HIGH (ref 4.8–5.6)
Mean Plasma Glucose: 143 mg/dL

## 2020-12-02 ENCOUNTER — Other Ambulatory Visit
Admission: RE | Admit: 2020-12-02 | Discharge: 2020-12-02 | Disposition: A | Discharge status: Home / Self Care | Payer: Medicaid Other | Source: Ambulatory Visit | Attending: Orthopedic Surgery | Admitting: Orthopedic Surgery

## 2020-12-02 ENCOUNTER — Other Ambulatory Visit: Payer: Self-pay

## 2020-12-02 DIAGNOSIS — Z01812 Encounter for preprocedural laboratory examination: Secondary | ICD-10-CM | POA: Diagnosis not present

## 2020-12-02 DIAGNOSIS — Z20822 Contact with and (suspected) exposure to covid-19: Secondary | ICD-10-CM | POA: Diagnosis not present

## 2020-12-02 LAB — SARS CORONAVIRUS 2 (TAT 6-24 HRS): SARS Coronavirus 2: NEGATIVE

## 2021-01-01 ENCOUNTER — Other Ambulatory Visit
Admission: RE | Admit: 2021-01-01 | Discharge: 2021-01-01 | Disposition: A | Discharge status: Home / Self Care | Payer: Medicaid Other | Source: Ambulatory Visit | Attending: Orthopedic Surgery | Admitting: Orthopedic Surgery

## 2021-01-03 ENCOUNTER — Ambulatory Visit
Admission: RE | Admit: 2021-01-03 | Discharge: 2021-01-03 | Disposition: A | Discharge status: Home / Self Care | Payer: Medicaid Other | Source: Ambulatory Visit | Attending: Orthopedic Surgery | Admitting: Orthopedic Surgery

## 2021-01-03 ENCOUNTER — Encounter: Payer: Self-pay | Admitting: Urgent Care

## 2021-01-03 ENCOUNTER — Other Ambulatory Visit
Admission: RE | Admit: 2021-01-03 | Discharge: 2021-01-03 | Disposition: A | Discharge status: Home / Self Care | Payer: Medicaid Other | Source: Ambulatory Visit | Attending: Orthopedic Surgery | Admitting: Orthopedic Surgery

## 2021-01-03 ENCOUNTER — Other Ambulatory Visit: Payer: Self-pay

## 2021-01-03 ENCOUNTER — Other Ambulatory Visit: Payer: Self-pay | Admitting: Orthopedic Surgery

## 2021-01-03 DIAGNOSIS — M1611 Unilateral primary osteoarthritis, right hip: Secondary | ICD-10-CM | POA: Diagnosis not present

## 2021-01-03 DIAGNOSIS — Z01812 Encounter for preprocedural laboratory examination: Secondary | ICD-10-CM | POA: Diagnosis present

## 2021-01-03 DIAGNOSIS — Z20822 Contact with and (suspected) exposure to covid-19: Secondary | ICD-10-CM | POA: Diagnosis not present

## 2021-01-03 LAB — TYPE AND SCREEN
ABO/RH(D): O POS
Antibody Screen: NEGATIVE

## 2021-01-03 LAB — SARS CORONAVIRUS 2 (TAT 6-24 HRS): SARS Coronavirus 2: NEGATIVE

## 2021-02-05 ENCOUNTER — Other Ambulatory Visit: Payer: Self-pay | Admitting: Orthopedic Surgery

## 2021-02-05 ENCOUNTER — Encounter: Payer: Self-pay | Admitting: Orthopedic Surgery

## 2021-02-05 DIAGNOSIS — M1611 Unilateral primary osteoarthritis, right hip: Secondary | ICD-10-CM

## 2021-02-05 NOTE — H&P (Signed)
  NAME: [name] Jensen MRN:   [id number] DOB:   [birth year]     HISTORY AND PHYSICAL  CHIEF COMPLAINT:  right hip pain  HISTORY:   [name] Jensen a 60 y.o. male  with right  Hip Pain Patient complains of right hip pain. Onset of the symptoms was several years ago. Inciting event: none. The patient reports the hip pain is worse with weight bearing. Associated symptoms: none. Aggravating symptoms include: any weight bearing. Patient has had prior hip problems. Previous visits for this problem: yes, last seen several weeks ago by me. Evaluation to date: plain films, which were abnormal  right hip osteoarthritis . Treatment to date: OTC analgesics, which have been somewhat effective, prescription analgesics, which have been somewhat effective, home exercise program, which has been somewhat effective, and physical therapy, which has been somewhat effective.     Plan for right total hip replacement  PAST MEDICAL HISTORY:   Past Medical History:  Diagnosis Date   Anxiety    Arthritis    right hip   Depression    Diabetes mellitus without complication (HCC)    Glaucoma    History of chicken pox    History of measles    History of mumps    Hyperlipidemia    Hypertension    Neuropathy     PAST SURGICAL HISTORY:   Past Surgical History:  Procedure Laterality Date   CYSTECTOMY  2003   total: Pilonidal   VASECTOMY  2003   WISDOM TOOTH EXTRACTION      MEDICATIONS:  (Not in a hospital admission)   ALLERGIES:  No Known Allergies  REVIEW OF SYSTEMS:   Negative except HPI  FAMILY HISTORY:   Family History  Problem Relation Age of Onset   Hypertension Father    Melanoma Father    Glaucoma Father    Osteoporosis Mother    Hiatal hernia Mother    Hypertension Sister     SOCIAL HISTORY:   reports that he has been smoking cigars. He has quit using smokeless tobacco. He reports current alcohol use. He reports that he does not use drugs.  PHYSICAL EXAM:  General appearance: alert,  cooperative, and no distress Neck: no JVD and supple, symmetrical, trachea midline Resp: clear to auscultation bilaterally Cardio: regular rate and rhythm, S1, S2 normal, no murmur, click, rub or gallop GI: soft, non-tender; bowel sounds normal; no masses,  no organomegaly Extremities: extremities normal, atraumatic, no cyanosis or edema and Homans sign is negative, no sign of DVT Pulses: 2+ and symmetric Skin: Skin color, texture, turgor normal. No rashes or lesions    LABORATORY STUDIES: No results for input(s): WBC, HGB, HCT, PLT in the last 72 hours.  No results for input(s): NA, K, CL, CO2, GLUCOSE, BUN, CREATININE, CALCIUM in the last 72 hours.  STUDIES/RESULTS:  No results found.  ASSESSMENT:  End stage osteoarthritis right hip        Active Problems:   * No active hospital problems. *    PLAN:  Right Primary Total Hip   Altamese Cabal 02/05/2021. 1:17 PM

## 2021-02-17 ENCOUNTER — Other Ambulatory Visit: Payer: Medicaid Other

## 2021-02-17 ENCOUNTER — Inpatient Hospital Stay: Admission: RE | Admit: 2021-02-17 | Payer: Medicaid Other | Source: Ambulatory Visit

## 2021-02-20 ENCOUNTER — Other Ambulatory Visit: Payer: Medicaid Other

## 2021-03-04 ENCOUNTER — Other Ambulatory Visit
Admission: RE | Admit: 2021-03-04 | Discharge: 2021-03-04 | Disposition: A | Discharge status: Home / Self Care | Payer: Medicaid Other | Source: Ambulatory Visit | Attending: Orthopedic Surgery | Admitting: Orthopedic Surgery

## 2021-03-04 DIAGNOSIS — Z01812 Encounter for preprocedural laboratory examination: Secondary | ICD-10-CM

## 2021-03-04 NOTE — Pre-Procedure Instructions (Signed)
Reviewed chart only, patient had PAT interview 7/22 for same procedure was cancelled due to insurance. Reviewed AVS sheet with patient and confirmed date for labs and Covid test. Patient verbalized understanding information given.

## 2021-03-05 ENCOUNTER — Other Ambulatory Visit: Payer: Self-pay

## 2021-03-13 ENCOUNTER — Other Ambulatory Visit: Payer: Medicaid Other

## 2021-03-13 ENCOUNTER — Other Ambulatory Visit: Payer: Self-pay

## 2021-03-13 ENCOUNTER — Other Ambulatory Visit
Admission: RE | Admit: 2021-03-13 | Discharge: 2021-03-13 | Disposition: A | Discharge status: Home / Self Care | Payer: Medicaid Other | Source: Ambulatory Visit | Attending: Orthopedic Surgery | Admitting: Orthopedic Surgery

## 2021-03-13 DIAGNOSIS — Z01812 Encounter for preprocedural laboratory examination: Secondary | ICD-10-CM | POA: Diagnosis present

## 2021-03-13 DIAGNOSIS — Z20822 Contact with and (suspected) exposure to covid-19: Secondary | ICD-10-CM | POA: Diagnosis not present

## 2021-03-13 DIAGNOSIS — M1611 Unilateral primary osteoarthritis, right hip: Secondary | ICD-10-CM | POA: Insufficient documentation

## 2021-03-13 LAB — CBC
HCT: 49.7 % (ref 39.0–52.0)
Hemoglobin: 15.8 g/dL (ref 13.0–17.0)
MCH: 27.1 pg (ref 26.0–34.0)
MCHC: 31.8 g/dL (ref 30.0–36.0)
MCV: 85.1 fL (ref 80.0–100.0)
Platelets: 235 10*3/uL (ref 150–400)
RBC: 5.84 MIL/uL — ABNORMAL HIGH (ref 4.22–5.81)
RDW: 16.8 % — ABNORMAL HIGH (ref 11.5–15.5)
WBC: 6.2 10*3/uL (ref 4.0–10.5)
nRBC: 0 % (ref 0.0–0.2)

## 2021-03-13 LAB — URINALYSIS, ROUTINE W REFLEX MICROSCOPIC
Bilirubin Urine: NEGATIVE
Glucose, UA: 150 mg/dL — AB
Hgb urine dipstick: NEGATIVE
Ketones, ur: NEGATIVE mg/dL
Leukocytes,Ua: NEGATIVE
Nitrite: NEGATIVE
Protein, ur: NEGATIVE mg/dL
Specific Gravity, Urine: 1.03 (ref 1.005–1.030)
pH: 5 (ref 5.0–8.0)

## 2021-03-13 LAB — BASIC METABOLIC PANEL
Anion gap: 8 (ref 5–15)
BUN: 22 mg/dL — ABNORMAL HIGH (ref 6–20)
CO2: 27 mmol/L (ref 22–32)
Calcium: 8.9 mg/dL (ref 8.9–10.3)
Chloride: 101 mmol/L (ref 98–111)
Creatinine, Ser: 1.09 mg/dL (ref 0.61–1.24)
GFR, Estimated: >60 mL/min (ref >60)
Glucose, Bld: 91 mg/dL (ref 70–99)
Potassium: 3.8 mmol/L (ref 3.5–5.1)
Sodium: 136 mmol/L (ref 135–145)

## 2021-03-13 LAB — PROTIME-INR
INR: 1.1 (ref 0.8–1.2)
Prothrombin Time: 13.7 seconds (ref 11.4–15.2)

## 2021-03-13 LAB — SURGICAL PCR SCREEN
MRSA, PCR: NEGATIVE
Staphylococcus aureus: NEGATIVE

## 2021-03-13 LAB — TYPE AND SCREEN
ABO/RH(D): O POS
Antibody Screen: NEGATIVE

## 2021-03-13 LAB — SARS CORONAVIRUS 2 (TAT 6-24 HRS): SARS Coronavirus 2: NEGATIVE

## 2021-03-13 LAB — APTT: aPTT: 32 seconds (ref 24–36)

## 2021-03-17 ENCOUNTER — Ambulatory Visit: Payer: Medicaid Other

## 2021-03-17 ENCOUNTER — Encounter
Admission: RE | Disposition: A | Discharge status: Home / Self Care | Payer: Self-pay | Source: Home / Self Care | Attending: Orthopedic Surgery

## 2021-03-17 ENCOUNTER — Observation Stay
Admission: RE | Admit: 2021-03-17 | Discharge: 2021-03-18 | Disposition: A | Discharge status: Home / Self Care | Payer: Medicaid Other | Attending: Orthopedic Surgery | Admitting: Orthopedic Surgery

## 2021-03-17 ENCOUNTER — Ambulatory Visit: Payer: Medicaid Other | Admitting: Urgent Care

## 2021-03-17 ENCOUNTER — Other Ambulatory Visit: Payer: Self-pay

## 2021-03-17 ENCOUNTER — Encounter: Payer: Self-pay | Admitting: Orthopedic Surgery

## 2021-03-17 DIAGNOSIS — E119 Type 2 diabetes mellitus without complications: Secondary | ICD-10-CM | POA: Diagnosis not present

## 2021-03-17 DIAGNOSIS — Z96641 Presence of right artificial hip joint: Secondary | ICD-10-CM

## 2021-03-17 DIAGNOSIS — I10 Essential (primary) hypertension: Secondary | ICD-10-CM | POA: Diagnosis not present

## 2021-03-17 DIAGNOSIS — Z419 Encounter for procedure for purposes other than remedying health state, unspecified: Secondary | ICD-10-CM

## 2021-03-17 DIAGNOSIS — M1611 Unilateral primary osteoarthritis, right hip: Secondary | ICD-10-CM | POA: Diagnosis not present

## 2021-03-17 HISTORY — PX: TOTAL HIP ARTHROPLASTY: SHX124

## 2021-03-17 LAB — GLUCOSE, CAPILLARY
Glucose-Capillary: 102 mg/dL — ABNORMAL HIGH (ref 70–99)
Glucose-Capillary: 117 mg/dL — ABNORMAL HIGH (ref 70–99)
Glucose-Capillary: 68 mg/dL — ABNORMAL LOW (ref 70–99)
Glucose-Capillary: 58 mg/dL — ABNORMAL LOW (ref 70–99)
Glucose-Capillary: 58 mg/dL — ABNORMAL LOW (ref 70–99)
Glucose-Capillary: 65 mg/dL — ABNORMAL LOW (ref 70–99)
Glucose-Capillary: 91 mg/dL (ref 70–99)
Glucose-Capillary: 122 mg/dL — ABNORMAL HIGH (ref 70–99)

## 2021-03-17 LAB — HEMOGLOBIN A1C
Hgb A1c MFr Bld: 6.4 % — ABNORMAL HIGH (ref 4.8–5.6)
Mean Plasma Glucose: 136.98 mg/dL

## 2021-03-17 SURGERY — ARTHROPLASTY, HIP, TOTAL, ANTERIOR APPROACH
Anesthesia: Spinal | Site: Hip | Laterality: Right

## 2021-03-17 MED ORDER — POVIDONE-IODINE 10 % EX SWAB
2.0000 "application " | Freq: Once | CUTANEOUS | Status: AC
  Administered 2021-03-17: 2 via TOPICAL

## 2021-03-17 MED ORDER — DEXMEDETOMIDINE (PRECEDEX) IN NS 20 MCG/5ML (4 MCG/ML) IV SYRINGE
PREFILLED_SYRINGE | INTRAVENOUS | Status: DC | PRN
  Administered 2021-03-17: 12 ug via INTRAVENOUS
  Administered 2021-03-17: 8 ug via INTRAVENOUS

## 2021-03-17 MED ORDER — PHENYLEPHRINE HCL-NACL 20-0.9 MG/250ML-% IV SOLN
INTRAVENOUS | Status: DC | PRN
Start: 2021-03-17 — End: 2021-03-17
  Administered 2021-03-17: 30 ug/min via INTRAVENOUS

## 2021-03-17 MED ORDER — DEXTROSE 50 % IV SOLN
INTRAVENOUS | Status: AC
  Filled 2021-03-17: qty 50

## 2021-03-17 MED ORDER — TRANEXAMIC ACID-NACL 1000-0.7 MG/100ML-% IV SOLN
INTRAVENOUS | Status: AC
  Filled 2021-03-17: qty 100

## 2021-03-17 MED ORDER — OXYCODONE HCL 5 MG PO TABS
ORAL_TABLET | ORAL | Status: AC
  Administered 2021-03-17: 10 mg via ORAL
  Filled 2021-03-17: qty 2

## 2021-03-17 MED ORDER — METOCLOPRAMIDE HCL 5 MG/ML IJ SOLN: 5.0000 mg | Freq: Three times a day (TID) | INTRAMUSCULAR | Status: DC | PRN

## 2021-03-17 MED ORDER — MENTHOL 3 MG MT LOZG
1.0000 | LOZENGE | OROMUCOSAL | Status: DC | PRN
  Filled 2021-03-17: qty 9

## 2021-03-17 MED ORDER — STERILE WATER FOR IRRIGATION IR SOLN
Status: DC | PRN
  Administered 2021-03-17: 1000 mL

## 2021-03-17 MED ORDER — INSULIN ASPART 100 UNIT/ML IJ SOLN: 0.0000 [IU] | Freq: Three times a day (TID) | INTRAMUSCULAR | Status: DC

## 2021-03-17 MED ORDER — FENTANYL CITRATE (PF) 100 MCG/2ML IJ SOLN
INTRAMUSCULAR | Status: AC
  Filled 2021-03-17: qty 2

## 2021-03-17 MED ORDER — LACTATED RINGERS IV SOLN: INTRAVENOUS | Status: DC

## 2021-03-17 MED ORDER — BISACODYL 10 MG RE SUPP
10.0000 mg | Freq: Every day | RECTAL | Status: DC | PRN
  Filled 2021-03-17: qty 1

## 2021-03-17 MED ORDER — LISINOPRIL 20 MG PO TABS
20.0000 mg | ORAL_TABLET | Freq: Every day | ORAL | Status: DC
  Administered 2021-03-17 – 2021-03-18 (×2): 20 mg via ORAL
  Filled 2021-03-17 (×2): qty 1

## 2021-03-17 MED ORDER — HYDROMORPHONE HCL 1 MG/ML IJ SOLN
INTRAMUSCULAR | Status: AC
  Administered 2021-03-17: 1 mg via INTRAVENOUS
  Filled 2021-03-17: qty 1

## 2021-03-17 MED ORDER — BUPIVACAINE HCL (PF) 0.5 % IJ SOLN
INTRAMUSCULAR | Status: DC | PRN
Start: 2021-03-17 — End: 2021-03-17
  Administered 2021-03-17: 2.6 mL

## 2021-03-17 MED ORDER — HYDROMORPHONE HCL 1 MG/ML IJ SOLN
0.5000 mg | INTRAMUSCULAR | Status: DC | PRN
  Administered 2021-03-18: 1 mg via INTRAVENOUS

## 2021-03-17 MED ORDER — INSULIN ASPART 100 UNIT/ML IJ SOLN: 0.0000 [IU] | Freq: Every day | INTRAMUSCULAR | Status: DC

## 2021-03-17 MED ORDER — METFORMIN HCL 500 MG PO TABS: 1000.0000 mg | ORAL_TABLET | Freq: Two times a day (BID) | ORAL | Status: DC

## 2021-03-17 MED ORDER — FAMOTIDINE 20 MG PO TABS: 20.0000 mg | ORAL_TABLET | Freq: Once | ORAL | Status: AC

## 2021-03-17 MED ORDER — FUROSEMIDE 20 MG PO TABS
20.0000 mg | ORAL_TABLET | Freq: Every morning | ORAL | Status: DC
  Administered 2021-03-18: 20 mg via ORAL
  Filled 2021-03-17: qty 1

## 2021-03-17 MED ORDER — TIMOLOL MALEATE 0.5 % OP SOLN
1.0000 [drp] | Freq: Two times a day (BID) | OPHTHALMIC | Status: DC
  Administered 2021-03-17 – 2021-03-18 (×2): 1 [drp] via OPHTHALMIC
  Filled 2021-03-17: qty 5

## 2021-03-17 MED ORDER — DIPHENHYDRAMINE HCL 12.5 MG/5ML PO ELIX
12.5000 mg | ORAL_SOLUTION | ORAL | Status: DC | PRN
  Filled 2021-03-17: qty 10

## 2021-03-17 MED ORDER — DAPAGLIFLOZIN PROPANEDIOL 10 MG PO TABS
10.0000 mg | ORAL_TABLET | Freq: Every day | ORAL | Status: DC
  Administered 2021-03-18: 10 mg via ORAL
  Filled 2021-03-17: qty 1

## 2021-03-17 MED ORDER — GLYCOPYRROLATE 0.2 MG/ML IJ SOLN
INTRAMUSCULAR | Status: DC | PRN
  Administered 2021-03-17: .2 mg via INTRAVENOUS

## 2021-03-17 MED ORDER — OXYCODONE HCL 5 MG PO TABS
ORAL_TABLET | ORAL | Status: AC
  Filled 2021-03-17: qty 1

## 2021-03-17 MED ORDER — CEFAZOLIN SODIUM-DEXTROSE 2-4 GM/100ML-% IV SOLN
INTRAVENOUS | Status: AC
  Administered 2021-03-18: 2 g via INTRAVENOUS
  Filled 2021-03-17: qty 100

## 2021-03-17 MED ORDER — DEXTROSE 50 % IV SOLN
25.0000 mL | Freq: Once | INTRAVENOUS | Status: AC
  Administered 2021-03-17: 25 mL via INTRAVENOUS

## 2021-03-17 MED ORDER — AMLODIPINE BESYLATE 10 MG PO TABS
10.0000 mg | ORAL_TABLET | Freq: Every day | ORAL | Status: DC
  Administered 2021-03-18: 10 mg via ORAL
  Filled 2021-03-17: qty 1

## 2021-03-17 MED ORDER — PROPOFOL 10 MG/ML IV BOLUS
INTRAVENOUS | Status: AC
  Filled 2021-03-17: qty 20

## 2021-03-17 MED ORDER — FENTANYL CITRATE (PF) 100 MCG/2ML IJ SOLN: 25.0000 ug | INTRAMUSCULAR | Status: DC | PRN

## 2021-03-17 MED ORDER — PHENYLEPHRINE HCL (PRESSORS) 10 MG/ML IV SOLN
INTRAVENOUS | Status: DC | PRN
  Administered 2021-03-17 (×2): 80 ug via INTRAVENOUS

## 2021-03-17 MED ORDER — CHLORHEXIDINE GLUCONATE 0.12 % MT SOLN: 15.0000 mL | Freq: Once | OROMUCOSAL | Status: AC

## 2021-03-17 MED ORDER — LATANOPROST 0.005 % OP SOLN
1.0000 [drp] | Freq: Every day | OPHTHALMIC | Status: DC
  Administered 2021-03-17: 1 [drp] via OPHTHALMIC
  Filled 2021-03-17: qty 2.5

## 2021-03-17 MED ORDER — SODIUM CHLORIDE 0.9 % IR SOLN
Status: DC | PRN
  Administered 2021-03-17: 3000 mL

## 2021-03-17 MED ORDER — METHOCARBAMOL 500 MG PO TABS
ORAL_TABLET | ORAL | Status: AC
  Filled 2021-03-17: qty 1

## 2021-03-17 MED ORDER — 0.9 % SODIUM CHLORIDE (POUR BTL) OPTIME
TOPICAL | Status: DC | PRN
  Administered 2021-03-17: 1000 mL

## 2021-03-17 MED ORDER — MEPERIDINE HCL 25 MG/ML IJ SOLN
6.2500 mg | INTRAMUSCULAR | Status: DC | PRN
Start: 2021-03-17 — End: 2021-03-17

## 2021-03-17 MED ORDER — METOCLOPRAMIDE HCL 10 MG PO TABS: 5.0000 mg | ORAL_TABLET | Freq: Three times a day (TID) | ORAL | Status: DC | PRN

## 2021-03-17 MED ORDER — CEFAZOLIN IN SODIUM CHLORIDE 3-0.9 GM/100ML-% IV SOLN
3.0000 g | INTRAVENOUS | Status: AC
  Administered 2021-03-17: 3 g via INTRAVENOUS
  Filled 2021-03-17: qty 100

## 2021-03-17 MED ORDER — CHLORHEXIDINE GLUCONATE 0.12 % MT SOLN
OROMUCOSAL | Status: AC
  Administered 2021-03-17: 15 mL via OROMUCOSAL
  Filled 2021-03-17: qty 15

## 2021-03-17 MED ORDER — KETOROLAC TROMETHAMINE 15 MG/ML IJ SOLN
INTRAMUSCULAR | Status: AC
  Filled 2021-03-17: qty 1

## 2021-03-17 MED ORDER — KETOROLAC TROMETHAMINE 15 MG/ML IJ SOLN
INTRAMUSCULAR | Status: AC
  Administered 2021-03-17: 15 mg via INTRAVENOUS
  Filled 2021-03-17: qty 1

## 2021-03-17 MED ORDER — SODIUM CHLORIDE 0.9 % IV SOLN: INTRAVENOUS | Status: DC | PRN

## 2021-03-17 MED ORDER — ACETAMINOPHEN 10 MG/ML IV SOLN
INTRAVENOUS | Status: AC
  Filled 2021-03-17: qty 100

## 2021-03-17 MED ORDER — LISINOPRIL-HYDROCHLOROTHIAZIDE 20-25 MG PO TABS: 1.0000 | ORAL_TABLET | Freq: Every day | ORAL | Status: DC

## 2021-03-17 MED ORDER — FENTANYL CITRATE (PF) 100 MCG/2ML IJ SOLN
INTRAMUSCULAR | Status: DC | PRN
  Administered 2021-03-17 (×2): 50 ug via INTRAVENOUS

## 2021-03-17 MED ORDER — MIDAZOLAM HCL 5 MG/5ML IJ SOLN
INTRAMUSCULAR | Status: DC | PRN
  Administered 2021-03-17: 2 mg via INTRAVENOUS

## 2021-03-17 MED ORDER — PROPOFOL 500 MG/50ML IV EMUL
INTRAVENOUS | Status: DC | PRN
  Administered 2021-03-17: 120 ug/kg/min via INTRAVENOUS

## 2021-03-17 MED ORDER — ONDANSETRON HCL 4 MG/2ML IJ SOLN: 4.0000 mg | Freq: Four times a day (QID) | INTRAMUSCULAR | Status: DC | PRN

## 2021-03-17 MED ORDER — MIDAZOLAM HCL 2 MG/2ML IJ SOLN
INTRAMUSCULAR | Status: AC
  Filled 2021-03-17: qty 2

## 2021-03-17 MED ORDER — HYDROCHLOROTHIAZIDE 25 MG PO TABS
25.0000 mg | ORAL_TABLET | Freq: Every day | ORAL | Status: DC
  Administered 2021-03-17 – 2021-03-18 (×2): 25 mg via ORAL
  Filled 2021-03-17 (×2): qty 1

## 2021-03-17 MED ORDER — EPHEDRINE SULFATE 50 MG/ML IJ SOLN
INTRAMUSCULAR | Status: DC | PRN
  Administered 2021-03-17: 10 mg via INTRAVENOUS
  Administered 2021-03-17: 5 mg via INTRAVENOUS
  Administered 2021-03-17: 10 mg via INTRAVENOUS

## 2021-03-17 MED ORDER — PROPOFOL 1000 MG/100ML IV EMUL
INTRAVENOUS | Status: AC
  Filled 2021-03-17: qty 100

## 2021-03-17 MED ORDER — BUPIVACAINE-EPINEPHRINE (PF) 0.25% -1:200000 IJ SOLN
INTRAMUSCULAR | Status: DC | PRN
  Administered 2021-03-17: 20 mL

## 2021-03-17 MED ORDER — CEFAZOLIN SODIUM-DEXTROSE 2-4 GM/100ML-% IV SOLN
INTRAVENOUS | Status: AC
  Filled 2021-03-17: qty 100

## 2021-03-17 MED ORDER — OXYCODONE HCL 5 MG PO TABS
5.0000 mg | ORAL_TABLET | ORAL | Status: DC | PRN
  Administered 2021-03-17: 5 mg via ORAL
  Administered 2021-03-18: 10 mg via ORAL

## 2021-03-17 MED ORDER — AMLODIPINE BESYLATE 5 MG PO TABS: 5.0000 mg | ORAL_TABLET | Freq: Every day | ORAL | Status: DC

## 2021-03-17 MED ORDER — FAMOTIDINE 20 MG PO TABS
ORAL_TABLET | ORAL | Status: AC
  Administered 2021-03-17: 20 mg via ORAL
  Filled 2021-03-17: qty 1

## 2021-03-17 MED ORDER — PRONTOSAN WOUND IRRIGATION OPTIME
TOPICAL | Status: DC | PRN
  Administered 2021-03-17: 1 via TOPICAL

## 2021-03-17 MED ORDER — GLIMEPIRIDE 4 MG PO TABS
4.0000 mg | ORAL_TABLET | Freq: Every day | ORAL | Status: DC
  Administered 2021-03-17 – 2021-03-18 (×2): 4 mg via ORAL
  Filled 2021-03-17 (×2): qty 1

## 2021-03-17 MED ORDER — BRIMONIDINE TARTRATE 0.2 % OP SOLN
1.0000 [drp] | Freq: Two times a day (BID) | OPHTHALMIC | Status: DC
  Administered 2021-03-17 – 2021-03-18 (×2): 1 [drp] via OPHTHALMIC
  Filled 2021-03-17: qty 5

## 2021-03-17 MED ORDER — ONDANSETRON HCL 4 MG/2ML IJ SOLN: 4.0000 mg | Freq: Once | INTRAMUSCULAR | Status: DC | PRN

## 2021-03-17 MED ORDER — OXYCODONE HCL 5 MG PO TABS
10.0000 mg | ORAL_TABLET | ORAL | Status: DC | PRN
  Administered 2021-03-17: 10 mg via ORAL

## 2021-03-17 MED ORDER — OXYCODONE HCL 5 MG PO TABS
ORAL_TABLET | ORAL | Status: AC
  Filled 2021-03-17: qty 2

## 2021-03-17 MED ORDER — TRANEXAMIC ACID-NACL 1000-0.7 MG/100ML-% IV SOLN
1000.0000 mg | INTRAVENOUS | Status: AC
  Administered 2021-03-17: 1000 mg via INTRAVENOUS

## 2021-03-17 MED ORDER — ACETAMINOPHEN 10 MG/ML IV SOLN
INTRAVENOUS | Status: DC | PRN
  Administered 2021-03-17: 1000 mg via INTRAVENOUS

## 2021-03-17 MED ORDER — BUPIVACAINE-EPINEPHRINE (PF) 0.25% -1:200000 IJ SOLN
INTRAMUSCULAR | Status: AC
  Filled 2021-03-17: qty 30

## 2021-03-17 MED ORDER — ORAL CARE MOUTH RINSE: 15.0000 mL | Freq: Once | OROMUCOSAL | Status: AC

## 2021-03-17 MED ORDER — ACETAMINOPHEN 325 MG PO TABS
325.0000 mg | ORAL_TABLET | Freq: Four times a day (QID) | ORAL | Status: DC | PRN
  Administered 2021-03-18: 650 mg via ORAL

## 2021-03-17 MED ORDER — MAGNESIUM HYDROXIDE 400 MG/5ML PO SUSP: 30.0000 mL | Freq: Every day | ORAL | Status: DC | PRN

## 2021-03-17 MED ORDER — METHOCARBAMOL 1000 MG/10ML IJ SOLN
500.0000 mg | Freq: Four times a day (QID) | INTRAVENOUS | Status: DC | PRN
  Filled 2021-03-17: qty 5

## 2021-03-17 MED ORDER — ASPIRIN 81 MG PO CHEW
CHEWABLE_TABLET | ORAL | Status: AC
  Administered 2021-03-17: 81 mg via ORAL
  Filled 2021-03-17: qty 1

## 2021-03-17 MED ORDER — DOCUSATE SODIUM 100 MG PO CAPS
100.0000 mg | ORAL_CAPSULE | Freq: Two times a day (BID) | ORAL | Status: DC
  Administered 2021-03-17 – 2021-03-18 (×2): 100 mg via ORAL
  Filled 2021-03-17 (×3): qty 1

## 2021-03-17 MED ORDER — ASPIRIN 81 MG PO CHEW: 81.0000 mg | CHEWABLE_TABLET | Freq: Two times a day (BID) | ORAL | Status: DC

## 2021-03-17 MED ORDER — ONDANSETRON HCL 4 MG PO TABS: 4.0000 mg | ORAL_TABLET | Freq: Four times a day (QID) | ORAL | Status: DC | PRN

## 2021-03-17 MED ORDER — SODIUM CHLORIDE 0.9 % IV SOLN: INTRAVENOUS | Status: DC

## 2021-03-17 MED ORDER — PHENOL 1.4 % MT LIQD
1.0000 | OROMUCOSAL | Status: DC | PRN
  Filled 2021-03-17: qty 177

## 2021-03-17 MED ORDER — METFORMIN HCL 500 MG PO TABS
500.0000 mg | ORAL_TABLET | Freq: Two times a day (BID) | ORAL | Status: DC
  Administered 2021-03-17 – 2021-03-18 (×2): 500 mg via ORAL
  Filled 2021-03-17 (×3): qty 1

## 2021-03-17 MED ORDER — DAPAGLIFLOZIN PROPANEDIOL 5 MG PO TABS: 5.0000 mg | ORAL_TABLET | Freq: Every day | ORAL | Status: DC

## 2021-03-17 MED ORDER — CEFAZOLIN SODIUM-DEXTROSE 2-4 GM/100ML-% IV SOLN
2.0000 g | Freq: Four times a day (QID) | INTRAVENOUS | Status: AC
  Administered 2021-03-17: 2 g via INTRAVENOUS

## 2021-03-17 MED ORDER — METHOCARBAMOL 500 MG PO TABS
500.0000 mg | ORAL_TABLET | Freq: Four times a day (QID) | ORAL | Status: DC | PRN
  Administered 2021-03-17: 500 mg via ORAL

## 2021-03-17 MED ORDER — KETOROLAC TROMETHAMINE 15 MG/ML IJ SOLN
15.0000 mg | Freq: Four times a day (QID) | INTRAMUSCULAR | Status: AC
  Administered 2021-03-17 – 2021-03-18 (×2): 15 mg via INTRAVENOUS

## 2021-03-17 MED ORDER — ALUM & MAG HYDROXIDE-SIMETH 200-200-20 MG/5ML PO SUSP: 30.0000 mL | ORAL | Status: DC | PRN

## 2021-03-17 SURGICAL SUPPLY — 54 items
BLADE SAGITTAL WIDE XTHICK NO (BLADE) ×2 IMPLANT
BRUSH SCRUB EZ  4% CHG (MISCELLANEOUS) ×1
BRUSH SCRUB EZ 4% CHG (MISCELLANEOUS) ×1 IMPLANT
CHLORAPREP W/TINT 26 (MISCELLANEOUS) ×2 IMPLANT
COVER HOLE (Hips) ×2 IMPLANT
CUP R3 54MM 3 HOLE (Hips) ×2 IMPLANT
DRAPE 3/4 80X56 (DRAPES) ×2 IMPLANT
DRAPE C-ARM 42X72 X-RAY (DRAPES) ×2 IMPLANT
DRAPE STERI IOBAN 125X83 (DRAPES) IMPLANT
DRSG AQUACEL AG ADV 3.5X10 (GAUZE/BANDAGES/DRESSINGS) ×2 IMPLANT
DRSG AQUACEL AG ADV 3.5X14 (GAUZE/BANDAGES/DRESSINGS) IMPLANT
ELECT REM PT RETURN 9FT ADLT (ELECTROSURGICAL) ×2
ELECTRODE REM PT RTRN 9FT ADLT (ELECTROSURGICAL) ×1 IMPLANT
GAUZE 4X4 16PLY ~~LOC~~+RFID DBL (SPONGE) IMPLANT
GAUZE XEROFORM 1X8 LF (GAUZE/BANDAGES/DRESSINGS) ×2 IMPLANT
GLOVE SURG ORTHO LTX SZ8 (GLOVE) ×4 IMPLANT
GLOVE SURG UNDER LTX SZ8 (GLOVE) ×2 IMPLANT
GOWN STRL REUS W/ TWL LRG LVL3 (GOWN DISPOSABLE) ×1 IMPLANT
GOWN STRL REUS W/ TWL XL LVL3 (GOWN DISPOSABLE) ×1 IMPLANT
GOWN STRL REUS W/TWL LRG LVL3 (GOWN DISPOSABLE) ×1
GOWN STRL REUS W/TWL XL LVL3 (GOWN DISPOSABLE) ×1
HEAD FEMORAL TAPER 36MM P0 (Head) ×2 IMPLANT
IRRIGATION SURGIPHOR STRL (IV SOLUTION) IMPLANT
IV NS 1000ML (IV SOLUTION) ×1
IV NS 1000ML BAXH (IV SOLUTION) ×1 IMPLANT
IV NS IRRIG 3000ML ARTHROMATIC (IV SOLUTION) ×2 IMPLANT
KIT PATIENT CARE HANA TABLE (KITS) ×2 IMPLANT
KIT TURNOVER CYSTO (KITS) ×2 IMPLANT
LINER ACETABULAR 36X54 OD (Liner) ×2 IMPLANT
MANIFOLD NEPTUNE II (INSTRUMENTS) ×2 IMPLANT
MAT ABSORB  FLUID 56X50 GRAY (MISCELLANEOUS) ×1
MAT ABSORB FLUID 56X50 GRAY (MISCELLANEOUS) ×1 IMPLANT
NDL SAFETY ECLIPSE 18X1.5 (NEEDLE) IMPLANT
NEEDLE HYPO 18GX1.5 SHARP (NEEDLE)
NEEDLE HYPO 22GX1.5 SAFETY (NEEDLE) ×2 IMPLANT
NEEDLE SPNL 20GX3.5 QUINCKE YW (NEEDLE) ×2 IMPLANT
PACK HIP PROSTHESIS (MISCELLANEOUS) ×2 IMPLANT
PADDING CAST BLEND 4X4 NS (MISCELLANEOUS) ×4 IMPLANT
PILLOW ABDUCTION MEDIUM (MISCELLANEOUS) ×2 IMPLANT
PULSAVAC PLUS IRRIG FAN TIP (DISPOSABLE) ×2
SCREW 6.5X25MM (Screw) ×2 IMPLANT
SOLUTION PRONTOSAN WOUND 350ML (IRRIGATION / IRRIGATOR) ×2 IMPLANT
SPONGE T-LAP 18X18 ~~LOC~~+RFID (SPONGE) ×4 IMPLANT
STAPLER SKIN PROX 35W (STAPLE) ×2 IMPLANT
STEM LATERAL COLLAR SZ LAT 3 (Stem) ×2 IMPLANT
SUT BONE WAX W31G (SUTURE) ×2 IMPLANT
SUT DVC 2 QUILL PDO  T11 36X36 (SUTURE) ×1
SUT DVC 2 QUILL PDO T11 36X36 (SUTURE) ×1 IMPLANT
SUT VIC AB 2-0 CT1 18 (SUTURE) ×2 IMPLANT
SYR 20ML LL LF (SYRINGE) ×2 IMPLANT
TIP FAN IRRIG PULSAVAC PLUS (DISPOSABLE) ×1 IMPLANT
WAND WEREWOLF FASTSEAL 6.0 (MISCELLANEOUS) ×2 IMPLANT
WATER STERILE IRR 1000ML POUR (IV SOLUTION) ×2 IMPLANT
WATER STERILE IRR 500ML POUR (IV SOLUTION) IMPLANT

## 2021-03-17 NOTE — Anesthesia Procedure Notes (Signed)
Spinal  Patient location during procedure: OR Start time: 03/17/2021 12:20 PM Reason for block: surgical anesthesia Staffing Performed: resident/CRNA  Preanesthetic Checklist Completed: patient identified, IV checked, site marked, risks and benefits discussed, surgical consent, monitors and equipment checked and pre-op evaluation Spinal Block Patient position: sitting Prep: DuraPrep Patient monitoring: heart rate, cardiac monitor, continuous pulse ox and blood pressure Approach: midline Location: L3-4 Injection technique: single-shot Needle Needle type: Sprotte  Needle gauge: 24 G Needle length: 9 cm Assessment Sensory level: T4 Events: CSF return Additional Notes Attempt x1.  Pt tolerated well.  Negative heme, neg paresthesia, no pain with injection, good free flow CSF pre/post injection.

## 2021-03-17 NOTE — H&P (Signed)
The patient has been re-examined, and the chart reviewed, and there have been no interval changes to the documented history and physical.  Plan a right total hip today.  Anesthesia is not consulted regarding a peripheral nerve block for post-operative pain.  The risks, benefits, and alternatives have been discussed at length, and the patient is willing to proceed.    

## 2021-03-17 NOTE — Transfer of Care (Signed)
Immediate Anesthesia Transfer of Care Note  Patient: [name] Jensen  Procedure(s) Performed: TOTAL HIP ARTHROPLASTY ANTERIOR APPROACH (Right: Hip)  Patient Location: PACU  Anesthesia Type:General  Level of Consciousness: drowsy  Airway & Oxygen Therapy: Patient Spontanous Breathing and Patient connected to face mask oxygen  Post-op Assessment: Report given to RN  Post vital signs: stable  Last Vitals:  Vitals Value Taken Time  BP 110/71 03/17/21 1430  Temp    Pulse 81 03/17/21 1432  Resp 15 03/17/21 1432  SpO2 100 % 03/17/21 1432  Vitals shown include unvalidated device data.  Last Pain:  Vitals:   03/17/21 1100  TempSrc: Temporal  PainSc: 6          Complications: No notable events documented.

## 2021-03-17 NOTE — Op Note (Signed)
03/17/2021  2:25 PM  PATIENT:  [name] Jensen   MRN: [id number]  PRE-OPERATIVE DIAGNOSIS:  Osteoarthritis right hip   POST-OPERATIVE DIAGNOSIS: Same  Procedure: Right Total Hip Replacement  Surgeon: Dola Argyle. Odis Luster, MD   Assist: Altamese Cabal, PA-C  Anesthesia: Spinal   EBL: 200 mL   Specimens: None   Drains: None   Components used: A size 3 lateral Polarstem [employer], R3 size 54 mm shell, and a 36 mm +0 mm head    Description of the procedure in detail: After informed consent was obtained and the appropriate extremity marked in the pre-operative holding area, the patient was taken to the operating room and placed in the supine position on the fracture table. All pressure points were well padded and bilateral lower extremities were place in traction spars. The hip was prepped and draped in standard sterile fashion. A spinal anesthetic had been delivered by the anesthesia team. The skin and subcutaneous tissues were injected with a mixture of Marcaine with epinephrine for post-operative pain. A longitudinal incision approximately 10 cm in length was carried out from the anterior superior iliac spine to the greater trochanter. The tensor fascia was divided and blunt dissection was taken down to the level of the joint capsule. The lateral circumflex vessels were cauterized. Deep retractors were placed and a portion of the anterior capsule was excised. Using fluoroscopy the neck cut was planned and carried out with a sagittal saw. The head was passed from the field with use of a corkscrew and hip skid. Deep retractors were placed along the acetabulum and the degenerative labrum and large osteophytes were removed with a Rongeur. The cup was sequentially reamed to a size 54 mm. The wound was irrigated and using fluoroscopy the size 54 mm cup was impacted in to anatomic position. A single screw was placed followed by a threaded hole cover. The final liner was impacted in to position.  Attention was then turned to the proximal femur. The leg was placed in extension and external rotation. The canal was opened and sequentially broached to a size 3 lateral. The trial components were placed and the hip relocated. The components were found to be in good position using fluoroscopy. The hip was dislocated and the trial components removed. The final components were impacted in to position and the hip relocated. The final components were again check with fluoroscopy and found to be in good position. Hemostasis was achieved with electrocautery. The deep capsule was injected with Marcaine and epinephrine. The wound was irrigated with bacitracin laced normal saline and the tensor fascia closed with #2 Quill suture. The subcutaneous tissues were closed with 2-0 vicryl and staples for the skin. A sterile dressing was applied and an abduction pillow. Patient tolerated the procedure well and there were no apparent complication. Patient was taken to the recovery room in good condition.   Cassell Smiles, MD

## 2021-03-17 NOTE — Anesthesia Postprocedure Evaluation (Signed)
Anesthesia Post Note  Patient: CHIRAG KRUEGER  Procedure(s) Performed: TOTAL HIP ARTHROPLASTY ANTERIOR APPROACH (Right: Hip)  Patient location during evaluation: PACU Anesthesia Type: Spinal Level of consciousness: awake and alert, awake and oriented Pain management: pain level controlled Vital Signs Assessment: post-procedure vital signs reviewed and stable Respiratory status: spontaneous breathing, nonlabored ventilation and respiratory function stable Cardiovascular status: blood pressure returned to baseline and stable Postop Assessment: no apparent nausea or vomiting Anesthetic complications: no   No notable events documented.   Last Vitals:  Vitals:   03/17/21 1657 03/17/21 1955  BP: (!) 163/93 (!) 170/92  Pulse: 95 99  Resp: 18 18  Temp: (!) 35.9 C (!) 36.3 C  SpO2: 100% 100%    Last Pain:  Vitals:   03/17/21 2000  TempSrc:   PainSc: 6                  Manfred Arch

## 2021-03-17 NOTE — Plan of Care (Signed)

## 2021-03-17 NOTE — Evaluation (Signed)
Physical Therapy Evaluation Patient Details Name: [name] Jensen MRN: [id number] DOB: December 11, 1960 Today's Date: 03/17/2021  History of Present Illness  Pt is a 60 yo male diagnosed with osteoarthritis right hip and is s/p elective R THA. PMH includes HTN, DM, anxiety, and depression.   Clinical Impression  Pt was pleasant and motivated to participate during the session and put forth good effort throughout. Pt stated having neuropathy in bilateral LE and decreased left ankle DF at baseline, but otherwise had sensation back in RLE. Pt was able to complete all ther ex in bed. Pt required min assist with bed mobility for RLE support and control. Pt was able to sit EOB with no symptoms of dizziness, nausea, or vomiting. Pt required min guard for STS with RW for safety. Pt was able to complete marching in standing and took 3 lateral steps at the EOB with min guard for safety. Pt could not continue session due to pain. Pt will benefit from HHPT upon discharge to safely address deficits listed in patient problem list for decreased caregiver assistance and eventual return to PLOF.   Recommendations for follow up therapy are one component of a multi-disciplinary discharge planning process, led by the attending physician.  Recommendations may be updated based on patient status, additional functional criteria and insurance authorization.  Follow Up Recommendations Home health PT    Assistance Recommended at Discharge Intermittent Supervision/Assistance  Functional Status Assessment Patient has had a recent decline in their functional status and demonstrates the ability to make significant improvements in function in a reasonable and predictable amount of time.  Equipment Recommendations  Other (comment) (Bariatric RW and Bariatric BSC)    Recommendations for Other Services       Precautions / Restrictions Precautions Precautions: Anterior Hip Precaution Booklet Issued: Yes  (comment) Restrictions Weight Bearing Restrictions: Yes RLE Weight Bearing: Weight bearing as tolerated      Mobility  Bed Mobility Overal bed mobility: Needs Assistance Bed Mobility: Supine to Sit;Sit to Supine     Supine to sit: Min assist Sit to supine: Min assist   General bed mobility comments: Increased time and effort, min assit with RLE control    Transfers Overall transfer level: Needs assistance Equipment used: Rolling walker (2 wheels) Transfers: Sit to/from Stand Sit to Stand: Min guard           General transfer comment: Elevated bed due to height, min cuing for hand placement, min guard for safety    Ambulation/Gait Ambulation/Gait assistance: Min guard Gait Distance (Feet): 2 Feet Assistive device: Rolling walker (2 wheels) Gait Pattern/deviations: Step-to pattern;Decreased step length - right;Decreased step length - left Gait velocity: decreased     General Gait Details: Pt took lateral steps at EOB with min guard for safety  Stairs            Wheelchair Mobility    Modified Rankin (Stroke Patients Only)       Balance Overall balance assessment: Needs assistance Sitting-balance support: Bilateral upper extremity supported;Feet supported Sitting balance-Leahy Scale: Good     Standing balance support: Bilateral upper extremity supported;During functional activity Standing balance-Leahy Scale: Good                               Pertinent Vitals/Pain Pain Assessment: 0-10 Pain Score: 8  Pain Location: R hip Pain Descriptors / Indicators: Sore;Aching Pain Intervention(s): Repositioned;Monitored during session;Premedicated before session    Home Living Family/patient expects to  be discharged to:: Private residence Living Arrangements: Other relatives Available Help at Discharge: Family;Available 24 hours/day Type of Home: House Home Access: Stairs to enter Entrance Stairs-Rails: Right Entrance Stairs-Number of  Steps: 4 Alternate Level Stairs-Number of Steps: 8 with right rail Home Layout: Two level;Bed/bath upstairs Home Equipment: Cane - single point      Prior Function Prior Level of Function : Independent/Modified Independent             Mobility Comments: Mod Ind amb community distances with a SPC, no fall history ADLs Comments: Ind with ADLs     Hand Dominance        Extremity/Trunk Assessment   Upper Extremity Assessment Upper Extremity Assessment: Overall WFL for tasks assessed    Lower Extremity Assessment Lower Extremity Assessment: RLE deficits/detail;LLE deficits/detail RLE Deficits / Details: Chronic neuropathy and reduced sensation to light touch to the foot, at baseline per patient; sensation to light touch intact above the ankle and pt able to actively move the RLE RLE Sensation: decreased light touch LLE Deficits / Details: Chronic neuropathy and reduced sensation to light touch to the foot, at baseline per patient; sensation to light touch intact above the ankle and pt able to actively move the LLE LLE Sensation: decreased light touch       Communication   Communication: No difficulties  Cognition Arousal/Alertness: Awake/alert Behavior During Therapy: WFL for tasks assessed/performed Overall Cognitive Status: Within Functional Limits for tasks assessed                                          General Comments      Exercises Total Joint Exercises Ankle Circles/Pumps: AROM;Strengthening;Both;10 reps;Supine Quad Sets: AROM;Strengthening;Both;10 reps;Supine Gluteal Sets: AROM;Both;10 reps;Supine Long Arc Quad: AROM;Strengthening;Both;10 reps;Seated Marching in Standing: AROM;Strengthening;Both;10 reps;Standing Other Exercises Other Exercises: Pt education on WBAT precautions   Assessment/Plan    PT Assessment Patient needs continued PT services  PT Problem List Decreased strength;Decreased range of motion;Decreased activity  tolerance;Decreased balance;Decreased mobility;Decreased knowledge of use of DME;Decreased knowledge of precautions;Pain       PT Treatment Interventions DME instruction;Gait training;Stair training;Functional mobility training;Therapeutic activities;Therapeutic exercise;Balance training;Patient/family education    PT Goals (Current goals can be found in the Care Plan section)  Acute Rehab PT Goals Patient Stated Goal: To walk normal PT Goal Formulation: With patient Time For Goal Achievement: 03/30/21 Potential to Achieve Goals: Good    Frequency BID   Barriers to discharge        Co-evaluation               AM-PAC PT "6 Clicks" Mobility  Outcome Measure Help needed turning from your back to your side while in a flat bed without using bedrails?: A Little Help needed moving from lying on your back to sitting on the side of a flat bed without using bedrails?: A Little Help needed moving to and from a bed to a chair (including a wheelchair)?: A Little Help needed standing up from a chair using your arms (e.g., wheelchair or bedside chair)?: A Little Help needed to walk in hospital room?: A Little Help needed climbing 3-5 steps with a railing? : A Little 6 Click Score: 18    End of Session Equipment Utilized During Treatment: Gait belt Activity Tolerance: Patient limited by pain Patient left: in bed;with call bell/phone within reach;with SCD's reapplied;with bed alarm set Nurse Communication: Mobility  status PT Visit Diagnosis: Other abnormalities of gait and mobility (R26.89);Muscle weakness (generalized) (M62.81);Pain Pain - Right/Left: Right Pain - part of body: Hip    Time: 1627-1700 PT Time Calculation (min) (ACUTE ONLY): 33 min   Charges:   PT Evaluation $PT Eval Moderate Complexity: 1 Mod PT Treatments $Therapeutic Exercise: 8-22 mins        Hildred Alamin SPT 03/17/21, 5:32 PM

## 2021-03-17 NOTE — Anesthesia Preprocedure Evaluation (Signed)
Anesthesia Evaluation  Patient identified by MRN, date of birth, ID band Patient awake    Reviewed: Allergy & Precautions, NPO status , Patient's Chart, lab work & pertinent test results  Airway Mallampati: III  TM Distance: >3 FB Neck ROM: Full    Dental  (+) Poor Dentition, Chipped   Pulmonary neg pulmonary ROS, Current Smoker and Patient abstained from smoking.,    Pulmonary exam normal        Cardiovascular hypertension, Pt. on medications negative cardio ROS Normal cardiovascular exam     Neuro/Psych PSYCHIATRIC DISORDERS Anxiety Depression negative neurological ROS  negative psych ROS   GI/Hepatic negative GI ROS, Neg liver ROS,   Endo/Other  negative endocrine ROSdiabetes  Renal/GU Renal diseasenegative Renal ROS  negative genitourinary   Musculoskeletal negative musculoskeletal ROS (+) Arthritis , Osteoarthritis,    Abdominal   Peds negative pediatric ROS (+)  Hematology negative hematology ROS (+)   Anesthesia Other Findings Anxiety    Arthritis  right hip  Depression    Diabetes mellitus without complication (HCC)    Glaucoma    History of chicken pox    History of measles    History of mumps    Hyperlipidemia    Hypertension    Neuropathy       Reproductive/Obstetrics negative OB ROS                             Anesthesia Physical Anesthesia Plan  ASA: 3  Anesthesia Plan: Spinal   Post-op Pain Management:    Induction: Intravenous  PONV Risk Score and Plan: 2 and Propofol infusion  Airway Management Planned: Mask  Additional Equipment:   Intra-op Plan:   Post-operative Plan:   Informed Consent: I have reviewed the patients History and Physical, chart, labs and discussed the procedure including the risks, benefits and alternatives for the proposed anesthesia with the patient or authorized representative who has indicated his/her understanding and  acceptance.       Plan Discussed with: CRNA, Anesthesiologist and Surgeon  Anesthesia Plan Comments:         Anesthesia Quick Evaluation

## 2021-03-18 ENCOUNTER — Encounter: Payer: Self-pay | Admitting: Orthopedic Surgery

## 2021-03-18 DIAGNOSIS — M1611 Unilateral primary osteoarthritis, right hip: Secondary | ICD-10-CM | POA: Diagnosis not present

## 2021-03-18 LAB — CBC
HCT: 42.2 % (ref 39.0–52.0)
Hemoglobin: 13.7 g/dL (ref 13.0–17.0)
MCH: 27.4 pg (ref 26.0–34.0)
MCHC: 32.5 g/dL (ref 30.0–36.0)
MCV: 84.4 fL (ref 80.0–100.0)
Platelets: 208 10*3/uL (ref 150–400)
RBC: 5 MIL/uL (ref 4.22–5.81)
RDW: 16.3 % — ABNORMAL HIGH (ref 11.5–15.5)
WBC: 8.3 10*3/uL (ref 4.0–10.5)
nRBC: 0 % (ref 0.0–0.2)

## 2021-03-18 LAB — BASIC METABOLIC PANEL
Anion gap: 10 (ref 5–15)
BUN: 15 mg/dL (ref 6–20)
CO2: 24 mmol/L (ref 22–32)
Calcium: 8.3 mg/dL — ABNORMAL LOW (ref 8.9–10.3)
Chloride: 99 mmol/L (ref 98–111)
Creatinine, Ser: 0.99 mg/dL (ref 0.61–1.24)
GFR, Estimated: >60 mL/min (ref >60)
Glucose, Bld: 153 mg/dL — ABNORMAL HIGH (ref 70–99)
Potassium: 3.2 mmol/L — ABNORMAL LOW (ref 3.5–5.1)
Sodium: 133 mmol/L — ABNORMAL LOW (ref 135–145)

## 2021-03-18 LAB — GLUCOSE, CAPILLARY
Glucose-Capillary: 134 mg/dL — ABNORMAL HIGH (ref 70–99)
Glucose-Capillary: 142 mg/dL — ABNORMAL HIGH (ref 70–99)
Glucose-Capillary: 141 mg/dL — ABNORMAL HIGH (ref 70–99)

## 2021-03-18 MED ORDER — ASPIRIN 81 MG PO CHEW: 81.0000 mg | CHEWABLE_TABLET | Freq: Two times a day (BID) | ORAL | 0 refills | Status: DC

## 2021-03-18 MED ORDER — INSULIN ASPART 100 UNIT/ML IJ SOLN
INTRAMUSCULAR | Status: AC
  Administered 2021-03-18: 2 [IU] via SUBCUTANEOUS
  Filled 2021-03-18: qty 1

## 2021-03-18 MED ORDER — ASPIRIN 81 MG PO CHEW
CHEWABLE_TABLET | ORAL | Status: AC
  Administered 2021-03-18: 81 mg via ORAL
  Filled 2021-03-18: qty 1

## 2021-03-18 MED ORDER — ACETAMINOPHEN 325 MG PO TABS
ORAL_TABLET | ORAL | Status: AC
  Filled 2021-03-18: qty 2

## 2021-03-18 MED ORDER — OXYCODONE HCL 5 MG PO TABS
ORAL_TABLET | ORAL | Status: AC
  Administered 2021-03-18: 15 mg via ORAL
  Filled 2021-03-18: qty 3

## 2021-03-18 MED ORDER — METHOCARBAMOL 500 MG PO TABS: 500.0000 mg | ORAL_TABLET | Freq: Four times a day (QID) | ORAL | 1 refills | Status: AC | PRN

## 2021-03-18 MED ORDER — DOCUSATE SODIUM 100 MG PO CAPS
100.0000 mg | ORAL_CAPSULE | Freq: Two times a day (BID) | ORAL | 0 refills | Status: AC
Start: 2021-03-18 — End: ?

## 2021-03-18 MED ORDER — HYDROMORPHONE HCL 1 MG/ML IJ SOLN
INTRAMUSCULAR | Status: AC
  Filled 2021-03-18: qty 1

## 2021-03-18 MED ORDER — OXYCODONE HCL 5 MG PO TABS
5.0000 mg | ORAL_TABLET | ORAL | 0 refills | Status: DC | PRN
Start: 2021-03-18 — End: 2021-06-17

## 2021-03-18 MED ORDER — HYDROMORPHONE HCL 1 MG/ML IJ SOLN
INTRAMUSCULAR | Status: AC
  Administered 2021-03-18: 0.5 mg via INTRAVENOUS
  Filled 2021-03-18: qty 0.5

## 2021-03-18 MED ORDER — KETOROLAC TROMETHAMINE 15 MG/ML IJ SOLN
INTRAMUSCULAR | Status: AC
  Filled 2021-03-18: qty 1

## 2021-03-18 MED ORDER — OXYCODONE HCL 5 MG PO TABS
ORAL_TABLET | ORAL | Status: AC
  Administered 2021-03-18: 10 mg via ORAL
  Filled 2021-03-18: qty 2

## 2021-03-18 MED ORDER — KETOROLAC TROMETHAMINE 15 MG/ML IJ SOLN
INTRAMUSCULAR | Status: AC
  Administered 2021-03-18: 15 mg via INTRAVENOUS
  Filled 2021-03-18: qty 1

## 2021-03-18 MED ORDER — OXYCODONE HCL 5 MG PO TABS
ORAL_TABLET | ORAL | Status: AC
  Filled 2021-03-18: qty 2

## 2021-03-18 MED ORDER — METHOCARBAMOL 500 MG PO TABS
ORAL_TABLET | ORAL | Status: AC
  Administered 2021-03-18: 500 mg via ORAL
  Filled 2021-03-18: qty 1

## 2021-03-18 NOTE — Discharge Instructions (Signed)

## 2021-03-18 NOTE — Progress Notes (Signed)
Physical Therapy Treatment Patient Details Name: [name] Jensen MRN: [id number] DOB: May 03, 1961 Today's Date: 03/18/2021   History of Present Illness Pt is a 60 yo male diagnosed with osteoarthritis right hip and is s/p elective R THA. PMH includes HTN, DM, anxiety, and depression.    PT Comments    Pt was pleasant and motivated to participate during the session and put forth good effort throughout. Pt was able to complete all ther ex in bed and perform bed mobility with min guard for safety. Pt was able to stand with min guard for safety and did not experience any nausea, vomiting, or dizziness, but required min cuing for hand placement as to not use the RW to pull himself up. Pt was able to ambulate 125 feet with no rest breaks and min guard for safety. Pt was able to complete stair sequencing with min cuing for hand placement and min guard for safety. SpO2 remained in the high 90s throughout the session and HR increased appropriately with exercise. Pt will benefit from HHPT upon discharge to safely address deficits listed in patient problem list for decreased caregiver assistance and eventual return to PLOF.   Recommendations for follow up therapy are one component of a multi-disciplinary discharge planning process, led by the attending physician.  Recommendations may be updated based on patient status, additional functional criteria and insurance authorization.  Follow Up Recommendations  Home health PT     Assistance Recommended at Discharge Intermittent Supervision/Assistance  Equipment Recommendations  Other (comment) (Bariatric BSC and Bariatric RW)    Recommendations for Other Services       Precautions / Restrictions Precautions Precautions: Anterior Hip Precaution Booklet Issued: Yes (comment) Restrictions Weight Bearing Restrictions: Yes RLE Weight Bearing: Weight bearing as tolerated     Mobility  Bed Mobility Overal bed mobility: Needs Assistance Bed Mobility:  Supine to Sit     Supine to sit: HOB elevated;Supervision     General bed mobility comments: Increased time and effort, did not require min assist today with RLE control, min guard for safety    Transfers Overall transfer level: Needs assistance Equipment used: Rolling walker (2 wheels) Transfers: Sit to/from Stand Sit to Stand: Min guard           General transfer comment: Elevated bed due to height, min cuing for hand placement and forward lean, min guard for safety    Ambulation/Gait Ambulation/Gait assistance: Min guard Gait Distance (Feet): 125 Feet Assistive device: Rolling walker (2 wheels) Gait Pattern/deviations: Step-to pattern;Decreased step length - right;Decreased step length - left;Decreased stride length;Decreased stance time - right Gait velocity: decreased     General Gait Details: Increased time and effort, min guard for safety   Stairs Stairs: Yes Stairs assistance: Min guard Stair Management: One rail Right;Step to pattern;Sideways Number of Stairs: 4 General stair comments: Min guard for safety, required min cuing for hand placement and proper sequencing   Wheelchair Mobility    Modified Rankin (Stroke Patients Only)       Balance Overall balance assessment: Needs assistance Sitting-balance support: Bilateral upper extremity supported;Feet supported Sitting balance-Leahy Scale: Good     Standing balance support: Bilateral upper extremity supported;During functional activity Standing balance-Leahy Scale: Good                              Cognition Arousal/Alertness: Awake/alert Behavior During Therapy: WFL for tasks assessed/performed Overall Cognitive Status: Within Functional Limits for tasks assessed  Exercises Total Joint Exercises Ankle Circles/Pumps: AROM;Strengthening;Both;10 reps;Supine Quad Sets: AROM;Strengthening;Both;10 reps;Supine Gluteal Sets:  AROM;Both;10 reps;Supine Long Arc Quad: AROM;Strengthening;Both;10 reps;Seated Marching in Standing: AROM;Strengthening;Both;10 reps;Standing Other Exercises Other Exercises: Pt education on proper stair sequencing with one R rail and step to pattern Other Exercises: Verbal pt education on car transfers    General Comments        Pertinent Vitals/Pain Pain Assessment: 0-10 Pain Score: 4  Pain Location: R hip Pain Descriptors / Indicators: Sore;Aching Pain Intervention(s): Repositioned;Ice applied    Home Living                          Prior Function            PT Goals (current goals can now be found in the care plan section) Acute Rehab PT Goals Patient Stated Goal: To walk normal PT Goal Formulation: With patient Time For Goal Achievement: 03/30/21 Potential to Achieve Goals: Good Progress towards PT goals: Progressing toward goals    Frequency    BID      PT Plan Current plan remains appropriate    Co-evaluation              AM-PAC PT "6 Clicks" Mobility   Outcome Measure  Help needed turning from your back to your side while in a flat bed without using bedrails?: A Little Help needed moving from lying on your back to sitting on the side of a flat bed without using bedrails?: A Little Help needed moving to and from a bed to a chair (including a wheelchair)?: A Little Help needed standing up from a chair using your arms (e.g., wheelchair or bedside chair)?: A Little Help needed to walk in hospital room?: A Little Help needed climbing 3-5 steps with a railing? : A Little 6 Click Score: 18    End of Session Equipment Utilized During Treatment: Gait belt Activity Tolerance: Patient tolerated treatment well Patient left: in chair;with call bell/phone within reach;with SCD's reapplied Nurse Communication: Mobility status PT Visit Diagnosis: Other abnormalities of gait and mobility (R26.89);Muscle weakness (generalized) (M62.81);Pain Pain -  Right/Left: Right Pain - part of body: Hip     Time: 6767-2094 PT Time Calculation (min) (ACUTE ONLY): 45 min  Charges:                        Hildred Alamin SPT 03/18/21, 1:16 PM

## 2021-03-18 NOTE — Discharge Summary (Signed)
Physician Discharge Summary  Patient ID: [name] Jensen MRN: [id number] DOB/AGE: [birth year] 60 y.o.  Admit date: 03/17/2021 Discharge date: 03/18/2021  Admission Diagnoses:  M16.11 Unilateral primary osteoarthritis, right hip <principal problem not specified>  Discharge Diagnoses:  M16.11 Unilateral primary osteoarthritis, right hip Active Problems:   History of total hip replacement, right   Past Medical History:  Diagnosis Date   Anxiety    Arthritis    right hip   Depression    Diabetes mellitus without complication (HCC)    Glaucoma    History of chicken pox    History of measles    History of mumps    Hyperlipidemia    Hypertension    Neuropathy     Surgeries: Procedure(s): TOTAL HIP ARTHROPLASTY ANTERIOR APPROACH on 03/17/2021   Consultants (if any):   Discharged Condition: Improved  Hospital Course: JAIDEV SANGER is an 60 y.o. male who was admitted 03/17/2021 with a diagnosis of  M16.11 Unilateral primary osteoarthritis, right hip <principal problem not specified> and went to the operating room on 03/17/2021 and underwent the above named procedures.    He was given perioperative antibiotics:  Anti-infectives (From admission, onward)    Start     Dose/Rate Route Frequency Ordered Stop   03/17/21 1845  ceFAZolin (ANCEF) IVPB 2g/100 mL premix        2 g 200 mL/hr over 30 Minutes Intravenous Every 6 hours 03/17/21 1529 03/18/21 0045   03/17/21 0600  ceFAZolin (ANCEF) IVPB 3g/100 mL premix        3 g 200 mL/hr over 30 Minutes Intravenous On call to O.R. 03/17/21 0036 03/17/21 1242     .  He was given sequential compression devices, early ambulation, and aspirin for DVT prophylaxis.  He benefited maximally from the hospital stay and there were no complications.    Recent vital signs:  Vitals:   03/18/21 0725 03/18/21 1216  BP: (!) 161/88 (!) 153/75  Pulse: (!) 108 (!) 104  Resp: 16 18  Temp: 97.8 F (36.6 C) 97.9 F (36.6 C)  SpO2: 100% 99%     Recent laboratory studies:  Lab Results  Component Value Date   HGB 13.7 03/18/2021   HGB 15.8 03/13/2021   HGB 16.2 11/22/2020   Lab Results  Component Value Date   WBC 8.3 03/18/2021   PLT 208 03/18/2021   Lab Results  Component Value Date   INR 1.1 03/13/2021   Lab Results  Component Value Date   NA 133 (L) 03/18/2021   K 3.2 (L) 03/18/2021   CL 99 03/18/2021   CO2 24 03/18/2021   BUN 15 03/18/2021   CREATININE 0.99 03/18/2021   GLUCOSE 153 (H) 03/18/2021    Discharge Medications:   Allergies as of 03/18/2021   No Known Allergies      Medication List     TAKE these medications    amLODipine 10 MG tablet Commonly known as: NORVASC Take 10 mg by mouth every morning.   amLODipine 5 MG tablet Commonly known as: NORVASC Take 1 tablet (5 mg total) by mouth daily. Reported on 07/29/2015   aspirin 81 MG chewable tablet Chew 1 tablet (81 mg total) by mouth 2 (two) times daily.   brimonidine 0.2 % ophthalmic solution Commonly known as: ALPHAGAN Place 1 drop into both eyes 2 (two) times daily.   dapagliflozin propanediol 5 MG Tabs tablet Commonly known as: FARXIGA Take 10 mg by mouth in the morning.   dapagliflozin propanediol 10 MG  Tabs tablet Commonly known as: Farxiga Take 1 tablet daily   docusate sodium 100 MG capsule Commonly known as: COLACE Take 1 capsule (100 mg total) by mouth 2 (two) times daily.   furosemide 20 MG tablet Commonly known as: LASIX Take 20 mg by mouth in the morning.   glimepiride 4 MG tablet Commonly known as: AMARYL TAKE 1 TABLET BY MOUTH EVERY DAY   latanoprost 0.005 % ophthalmic solution Commonly known as: XALATAN Place 1 drop into both eyes at bedtime.   lisinopril-hydrochlorothiazide 20-25 MG tablet Commonly known as: ZESTORETIC TAKE 1 TABLET BY MOUTH EVERY DAY   meloxicam 15 MG tablet Commonly known as: MOBIC Take 15 mg by mouth daily.   metFORMIN 500 MG tablet Commonly known as: GLUCOPHAGE Take 500  mg by mouth 2 (two) times daily with a meal.   metFORMIN 1000 MG tablet Commonly known as: GLUCOPHAGE TAKE 1 TABLET BY MOUTH TWICE DAILY WITH FOOD   methocarbamol 500 MG tablet Commonly known as: ROBAXIN Take 1 tablet (500 mg total) by mouth every 6 (six) hours as needed for muscle spasms.   oxyCODONE 5 MG immediate release tablet Commonly known as: Oxy IR/ROXICODONE Take 1-2 tablets (5-10 mg total) by mouth every 4 (four) hours as needed for moderate pain (pain score 4-6).   sildenafil 100 MG tablet Commonly known as: VIAGRA Take 100 mg by mouth daily as needed.   sildenafil 20 MG tablet Commonly known as: REVATIO 3-5 tablets as needed prior to intercourse, not to exceed 5 tablets in a day   testosterone cypionate 200 MG/ML injection Commonly known as: DEPOTESTOSTERONE CYPIONATE Inject 200 mg into the skin every 14 (fourteen) days.   timolol 0.5 % ophthalmic solution Commonly known as: TIMOPTIC Place 1 drop into both eyes 2 (two) times daily.   traMADol 50 MG tablet Commonly known as: ULTRAM Take 50 mg by mouth 2 (two) times daily as needed for moderate pain.               Durable Medical Equipment  (From admission, onward)           Start     Ordered   03/18/21 1338  For home use only DME Walker rolling  Once       Question Answer Comment  Walker: With 5 Inch Wheels   Patient needs a walker to treat with the following condition Osteoarthritis of right hip      03/18/21 1338   03/18/21 1337  For home use only DME 3 n 1  Once        03/18/21 1338   03/17/21 1530  DME Walker rolling  Once       Question:  Patient needs a walker to treat with the following condition  Answer:  History of total hip replacement, right   03/17/21 1529   03/17/21 1530  DME 3 n 1  Once        03/17/21 1529   03/17/21 1530  DME Bedside commode  Once       Question:  Patient needs a bedside commode to treat with the following condition  Answer:  History of total hip replacement,  right   03/17/21 1529            Diagnostic Studies: DG HIP OPERATIVE UNILAT W OR W/O PELVIS RIGHT  Result Date: 03/17/2021 CLINICAL DATA:  RIGHT hip replacement. EXAM: OPERATIVE RIGHT HIP (WITH PELVIS IF PERFORMED)  VIEWS TECHNIQUE: Fluoroscopic spot image(s) were submitted for interpretation post-operatively.  COMPARISON:  None. FINDINGS: Images show various stages of total RIGHT hip arthroplasty. The femoral head component is well seated in the acetabular component on the last image provided. IMPRESSION: RIGHT total hip arthroplasty. Electronically Signed   By: Norva Pavlov M.D.   On: 03/17/2021 14:33    Disposition: Discharge disposition: 01-Home or Self Care            Signed: Altamese Cabal ,PA-C 03/18/2021, 1:39 PM

## 2021-03-18 NOTE — TOC Initial Note (Signed)
Transition of Care [facility]) - Initial/Assessment Note    Patient Details  Name: [name] Jensen MRN: [id number] Date of Birth: Sep 04, 1960  [facility]) CM/SW Contact:     Cellar, RN Phone Number: 03/18/2021, 12:24 PM  Clinical Narrative:                 Spoke with patient at bedside. Patient reports he is eager to discharge home. His father and other family members will be assisting with needs after discharge. Patient reports he is already set up with OP PT and has transportation assistance. Currently needs bariatric RW and BSC.   RN CM contacted [employer] with request for equipment of bariatric RW and BSC.          Patient Goals and CMS Choice        Expected Discharge Plan and Services                                                Prior Living Arrangements/Services                       Activities of Daily Living Home Assistive Devices/Equipment: Cane (specify quad or straight) ADL Screening (condition at time of admission) Patient's cognitive ability adequate to safely complete daily activities?: No Is the patient deaf or have difficulty hearing?: No Does the patient have difficulty seeing, even when wearing glasses/contacts?: No Does the patient have difficulty concentrating, remembering, or making decisions?: No Patient able to express need for assistance with ADLs?: No Does the patient have difficulty dressing or bathing?: No Independently performs ADLs?: Yes (appropriate for developmental age) Does the patient have difficulty walking or climbing stairs?: Yes Weakness of Legs: Both Weakness of Arms/Hands: None  Permission Sought/Granted                  Emotional Assessment              Admission diagnosis:  History of total hip replacement, right [L79.892] Patient Active Problem List   Diagnosis Date Noted   History of total hip replacement, right 03/17/2021   Substance induced mood disorder (HCC) 08/23/2017    Alcohol abuse 08/23/2017   Self-inflicted laceration of wrist (HCC) 08/23/2017   Glaucoma 06/05/2015   Neuropathy 06/05/2015   Essential hypertension 02/01/2015   Osteoarthrosis 10/19/2008   Arthralgia of lower leg 10/18/2008   Atypical chest pain 05/22/2008   Insomnia 05/14/2008   HLD (hyperlipidemia) 03/05/2007   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

## 2021-03-19 LAB — SURGICAL PATHOLOGY

## 2021-03-23 ENCOUNTER — Encounter: Payer: Self-pay | Admitting: Emergency Medicine

## 2021-03-23 ENCOUNTER — Emergency Department
Admission: EM | Admit: 2021-03-23 | Discharge: 2021-03-23 | Disposition: A | Discharge status: Home / Self Care | Payer: Medicaid Other | Attending: Emergency Medicine | Admitting: Emergency Medicine

## 2021-03-23 ENCOUNTER — Other Ambulatory Visit: Payer: Self-pay

## 2021-03-23 ENCOUNTER — Emergency Department: Payer: Medicaid Other

## 2021-03-23 DIAGNOSIS — F17290 Nicotine dependence, other tobacco product, uncomplicated: Secondary | ICD-10-CM | POA: Insufficient documentation

## 2021-03-23 DIAGNOSIS — Z96641 Presence of right artificial hip joint: Secondary | ICD-10-CM | POA: Insufficient documentation

## 2021-03-23 DIAGNOSIS — E1140 Type 2 diabetes mellitus with diabetic neuropathy, unspecified: Secondary | ICD-10-CM | POA: Diagnosis not present

## 2021-03-23 DIAGNOSIS — R42 Dizziness and giddiness: Secondary | ICD-10-CM | POA: Insufficient documentation

## 2021-03-23 DIAGNOSIS — Z7984 Long term (current) use of oral hypoglycemic drugs: Secondary | ICD-10-CM | POA: Diagnosis not present

## 2021-03-23 DIAGNOSIS — Z7982 Long term (current) use of aspirin: Secondary | ICD-10-CM | POA: Diagnosis not present

## 2021-03-23 DIAGNOSIS — Z79899 Other long term (current) drug therapy: Secondary | ICD-10-CM | POA: Insufficient documentation

## 2021-03-23 DIAGNOSIS — I10 Essential (primary) hypertension: Secondary | ICD-10-CM | POA: Diagnosis not present

## 2021-03-23 LAB — CBC
HCT: 43.3 % (ref 39.0–52.0)
Hemoglobin: 13.5 g/dL (ref 13.0–17.0)
MCH: 26.4 pg (ref 26.0–34.0)
MCHC: 31.2 g/dL (ref 30.0–36.0)
MCV: 84.7 fL (ref 80.0–100.0)
Platelets: 292 10*3/uL (ref 150–400)
RBC: 5.11 MIL/uL (ref 4.22–5.81)
RDW: 15.9 % — ABNORMAL HIGH (ref 11.5–15.5)
WBC: 7.4 10*3/uL (ref 4.0–10.5)
nRBC: 0 % (ref 0.0–0.2)

## 2021-03-23 LAB — BASIC METABOLIC PANEL
Anion gap: 11 (ref 5–15)
BUN: 16 mg/dL (ref 6–20)
CO2: 27 mmol/L (ref 22–32)
Calcium: 9.3 mg/dL (ref 8.9–10.3)
Chloride: 97 mmol/L — ABNORMAL LOW (ref 98–111)
Creatinine, Ser: 0.94 mg/dL (ref 0.61–1.24)
GFR, Estimated: >60 mL/min (ref >60)
Glucose, Bld: 146 mg/dL — ABNORMAL HIGH (ref 70–99)
Potassium: 3.3 mmol/L — ABNORMAL LOW (ref 3.5–5.1)
Sodium: 135 mmol/L (ref 135–145)

## 2021-03-23 MED ORDER — DIAZEPAM 5 MG PO TABS: 5.0000 mg | ORAL_TABLET | Freq: Two times a day (BID) | ORAL | 0 refills | Status: AC | PRN

## 2021-03-23 MED ORDER — FLUTICASONE PROPIONATE 50 MCG/ACT NA SUSP: 1.0000 | Freq: Every day | NASAL | 0 refills | Status: AC

## 2021-03-23 MED ORDER — DIAZEPAM 5 MG PO TABS
5.0000 mg | ORAL_TABLET | Freq: Once | ORAL | Status: AC
  Administered 2021-03-23: 5 mg via ORAL
  Filled 2021-03-23: qty 1

## 2021-03-23 MED ORDER — POTASSIUM CHLORIDE CRYS ER 20 MEQ PO TBCR
40.0000 meq | EXTENDED_RELEASE_TABLET | Freq: Once | ORAL | Status: AC
  Administered 2021-03-23: 40 meq via ORAL
  Filled 2021-03-23: qty 2

## 2021-03-23 MED ORDER — SODIUM CHLORIDE 0.9 % IV BOLUS
500.0000 mL | Freq: Once | INTRAVENOUS | Status: AC
  Administered 2021-03-23: 500 mL via INTRAVENOUS

## 2021-03-23 MED ORDER — MECLIZINE HCL 25 MG PO TABS: 25.0000 mg | ORAL_TABLET | Freq: Three times a day (TID) | ORAL | 0 refills | Status: AC | PRN

## 2021-03-23 NOTE — ED Triage Notes (Signed)
Pt reports dizziness since Thursday am at 0400. Pt reports has been hydrating. Pt states he rolled over in the bed and it felt like his eye balls were rolling, he could get them to stop

## 2021-03-23 NOTE — ED Triage Notes (Signed)
Pt in via EMS from home with c/o dizziness for 3 days. EMS reports pt states he gets nauseated when he stands, had hip surgery last Monday and was recently taken off of oxycodone and put on tramadol. 109HR, 152/102, 100% RA, pt has not taken meds this am

## 2021-03-23 NOTE — Discharge Instructions (Addendum)
Take the meclizine as needed for mild dizziness. If symptoms are severe take the valium.  Use Flonase, can be purchased over the counter, daily for at least 10-14 days to help your ears drain  If symptoms do not improve, call your primary for follow-up and possibLe ENT referral

## 2021-03-23 NOTE — ED Provider Notes (Signed)
[facility] Emergency Department Provider Note  ____________________________________________   Event Date/Time   First MD Initiated Contact with Patient 03/23/21 1222     (approximate)  I have reviewed the triage vital signs and the nursing notes.   HISTORY  Chief Complaint Dizziness    HPI [name] Jensen is a 60 y.o. male  with PMHx HTN, obesity, DM, here with dizziness. Pt just had a hip replacement and returned home on Monday. Reports that over past 2 days or so, he's had increasingly frequent and severe episodes of dizziness. He states his sx seem to hit him more in bed, when he turns, and he experiences acute onset of feeling like the room/bed is still turning and spinning, along with loss of balance. Sx have been fairly constant today. He has had them looking both left and right, but mostly with position changes. No h/o vertigo. No tinnitus. No aural fullness. Denies any difficulty swallowing or speaking. No other complaints. No focal numbness, weakness. He has been ambulatory w/ his walker. Is on ASA for DVT ppx, denies any leg swelling.        Past Medical History:  Diagnosis Date   Anxiety    Arthritis    right hip   Depression    Diabetes mellitus without complication (HCC)    Glaucoma    History of chicken pox    History of measles    History of mumps    Hyperlipidemia    Hypertension    Neuropathy     Patient Active Problem List   Diagnosis Date Noted   History of total hip replacement, right 03/17/2021   Substance induced mood disorder (HCC) 08/23/2017   Alcohol abuse 08/23/2017   Self-inflicted laceration of wrist (HCC) 08/23/2017   Glaucoma 06/05/2015   Neuropathy 06/05/2015   Essential hypertension 02/01/2015   Osteoarthrosis 10/19/2008   Arthralgia of lower leg 10/18/2008   Atypical chest pain 05/22/2008   Insomnia 05/14/2008   HLD (hyperlipidemia) 03/05/2007   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 Authorizing Provider  diazepam (VALIUM) 5 MG tablet Take 1 tablet (5 mg total) by mouth every 12 (twelve) hours as needed (severe dizziness). 03/23/21 03/23/22 Yes Shaune Pollack, MD  fluticasone (FLONASE) 50 MCG/ACT nasal spray Place 1 spray into both nostrils daily. 03/23/21 03/23/22 Yes Shaune Pollack, MD  meclizine (ANTIVERT) 25 MG tablet Take 1 tablet (25 mg total) by mouth 3 (three) times daily as needed for dizziness. 03/23/21  Yes Shaune Pollack, MD  amLODipine (NORVASC) 10 MG tablet Take 10 mg by mouth every morning.    [provider]  amLODipine (NORVASC) 5 MG tablet Take 1 tablet (5 mg total) by mouth daily. Reported on 07/29/2015 Patient not taking: Reported on 03/17/2021 03/06/16   Malva Limes, MD  aspirin 81 MG chewable tablet Chew 1 tablet (81 mg total) by mouth 2 (two) times daily. 03/18/21   Altamese Cabal, PA-C  brimonidine (ALPHAGAN) 0.2 %  ophthalmic solution Place 1 drop into both eyes 2 (two) times daily. 06/10/17   [provider]  dapagliflozin propanediol (FARXIGA) 10 MG TABS tablet Take 1 tablet daily Patient not taking: No sig reported 05/06/17   Malva Limes, MD  dapagliflozin propanediol (FARXIGA) 5 MG TABS tablet Take 10 mg by mouth in the morning.    [provider]  docusate sodium (COLACE) 100 MG capsule Take 1 capsule (100 mg total) by mouth 2 (two) times daily. 03/18/21   Altamese Cabal, PA-C  furosemide (LASIX) 20 MG tablet Take 20 mg by mouth  in the morning. 02/20/20   [provider]  glimepiride (AMARYL) 4 MG tablet TAKE 1 TABLET BY MOUTH EVERY DAY 07/21/18   Malva Limes, MD  latanoprost (XALATAN) 0.005 % ophthalmic solution Place 1 drop into both eyes at bedtime.  05/04/06   [provider]  lisinopril-hydrochlorothiazide (ZESTORETIC) 20-25 MG tablet TAKE 1 TABLET BY MOUTH EVERY DAY 09/13/18   Malva Limes, MD  meloxicam (MOBIC) 15 MG tablet Take 15 mg by mouth daily. 02/20/20   [provider]  metFORMIN (GLUCOPHAGE) 1000 MG tablet TAKE 1 TABLET BY MOUTH TWICE DAILY WITH FOOD Patient not taking: Reported on 02/12/2021 02/08/19   Malva Limes, MD  metFORMIN (GLUCOPHAGE) 500 MG tablet Take 500 mg by mouth 2 (two) times daily with a meal.    [provider]  methocarbamol (ROBAXIN) 500 MG tablet Take 1 tablet (500 mg total) by mouth every 6 (six) hours as needed for muscle spasms. 03/18/21   Altamese Cabal, PA-C  oxyCODONE (OXY IR/ROXICODONE) 5 MG immediate release tablet Take 1-2 tablets (5-10 mg total) by mouth every 4 (four) hours as needed for moderate pain (pain score 4-6). 03/18/21   Altamese Cabal, PA-C  sildenafil (REVATIO) 20 MG tablet 3-5 tablets as needed prior to intercourse, not to exceed 5 tablets in a day Patient not taking: Reported on 02/12/2021 03/29/17   Malva Limes, MD  sildenafil (VIAGRA) 100 MG tablet Take 100 mg by mouth daily as needed. 02/06/21   [provider]  testosterone cypionate (DEPOTESTOSTERONE CYPIONATE) 200 MG/ML injection Inject 200 mg into the skin every 14 (fourteen) days. 03/06/20   [provider]  timolol (TIMOPTIC) 0.5 % ophthalmic solution Place 1 drop into both eyes 2 (two) times daily. 06/10/17   [provider]  traMADol (ULTRAM) 50 MG tablet Take 50 mg by mouth 2 (two) times daily as needed for moderate pain. 03/12/20   [provider]    Allergies Patient has no known allergies.  Family History  Problem  Relation Age of Onset   Hypertension Father    Melanoma Father    Glaucoma Father    Osteoporosis Mother    Hiatal hernia Mother    Hypertension Sister     Social History Social History   Tobacco Use   Smoking status: Some Days    Types: Cigars   Smokeless tobacco: Former   Tobacco comments:    smokes 3 cigars a week.   Vaping Use   Vaping Use: Never used  Substance Use Topics   Alcohol use: Yes    Alcohol/week: 0.0 standard drinks    Comment: occasionally    Drug use: No    Review of Systems  Review of Systems  Constitutional:  Positive for fatigue. Negative for chills and fever.  HENT:  Negative for sore throat.   Respiratory:  Negative for shortness of breath.   Cardiovascular:  Negative for chest pain.  Gastrointestinal:  Negative for abdominal pain.  Genitourinary:  Negative for flank pain.  Musculoskeletal:  Positive for gait problem. Negative for neck pain.  Skin:  Negative for rash and wound.  Allergic/Immunologic: Negative for immunocompromised state.  Neurological:  Positive for dizziness. Negative for weakness and numbness.  Hematological:  Does not bruise/bleed easily.    ____________________________________________  PHYSICAL EXAM:      VITAL SIGNS: ED Triage Vitals  Enc Vitals Group     BP 03/23/21 1230 111/84     Pulse Rate 03/23/21 1245 99     Resp 03/23/21 1230 17     Temp --      Temp src --      SpO2 03/23/21 1245 98 %     Weight 03/23/21 0936 (!) 321 lb (145.6 kg)     Height 03/23/21 0936 6\' 4"  (1.93 m)     Head Circumference --      Peak Flow --      Pain Score 03/23/21 0935 5     Pain Loc --      Pain Edu? --      Excl. in GC? --      Physical Exam Vitals and nursing note reviewed.  Constitutional:      General: He is not in acute distress.    Appearance: He is well-developed.  HENT:     Head: Normocephalic and atraumatic.  Eyes:     Conjunctiva/sclera: Conjunctivae normal.  Cardiovascular:     Rate and Rhythm: Normal  rate and regular rhythm.     Heart sounds: Normal heart sounds.  Pulmonary:     Effort: Pulmonary effort is normal. No respiratory distress.     Breath sounds: No wheezing.  Abdominal:     General: There is no distension.  Musculoskeletal:     Cervical back: Neck supple.  Skin:    General: Skin is warm.     Capillary Refill: Capillary refill takes less than 2 seconds.     Findings: No rash.  Neurological:     Mental Status: He is alert and oriented to person, place, and time.     Motor: No abnormal muscle tone.     Comments: Neurological Exam:  Mental Status: Alert and oriented to person, place, and time. Attention and concentration normal. Speech clear. Recent memory is intact. Cranial Nerves: Visual fields grossly intact. EOMI and PERRLA. Horizontal nystagmus noted, worst with rightward gaze/head movement, fatigues and has slight latency. Facial sensation intact at forehead, maxillary cheek, and chin/mandible bilaterally. No facial asymmetry or weakness. Hearing grossly normal. Uvula is midline, and palate elevates symmetrically. Normal SCM and trapezius strength. Tongue midline without fasciculations. Motor: Muscle strength 5/5 in proximal and distal UE and LE bilaterally. No pronator drift. Muscle tone normal. Sensation: Intact to light touch in upper and lower extremities distally bilaterally.  Gait: Deferred Coordination: Slight past pointing noted on left FTN.         ____________________________________________   LABS (all labs ordered are listed, but only abnormal results are displayed)  Labs Reviewed  BASIC METABOLIC PANEL - Abnormal; Notable for the following components:      Result Value   Potassium 3.3 (*)    Chloride 97 (*)    Glucose, Bld 146 (*)    All other components within normal limits  CBC - Abnormal; Notable for the following components:   RDW 15.9 (*)    All other components within normal limits  URINALYSIS, ROUTINE  W REFLEX MICROSCOPIC     ____________________________________________  EKG: Sinus tachycardia, VR 110. PR 160, QRS 96, QTc 470. PVC noted. Non-specific TW changes. No acute ST elevations. ________________________________________  RADIOLOGY All imaging, including plain films, CT scans, and ultrasounds, independently reviewed by me, and interpretations confirmed via formal radiology reads.  ED MD interpretation:   MR Brain  Normal brain MRI  Official radiology report(s): MR BRAIN WO CONTRAST  Result Date: 03/23/2021 CLINICAL DATA:  Dizziness for 3 days EXAM: MRI HEAD WITHOUT CONTRAST TECHNIQUE: Multiplanar, multiecho pulse sequences of the brain and surrounding structures were obtained without intravenous contrast. COMPARISON:  None. FINDINGS: Brain: There is no evidence of acute intracranial hemorrhage, extra-axial fluid collection, or acute infarct. Parenchymal volume is normal. There is no parenchymal signal abnormality. There is no solid mass lesion.  There is no midline shift. Vascular: Normal flow voids. Skull and upper cervical spine: Normal marrow signal. Sinuses/Orbits: The paranasal sinuses are clear. The globes and orbits are unremarkable. Other: None. IMPRESSION: Normal brain MRI. Electronically Signed   By: Lesia Hausen M.D.   On: 03/23/2021 14:15    ____________________________________________  PROCEDURES   Procedure(s) performed (including Critical Care):  Procedures  ____________________________________________  INITIAL IMPRESSION / MDM / ASSESSMENT AND PLAN / ED COURSE  As part of my medical decision making, I reviewed the following data within the electronic MEDICAL RECORD NUMBER Nursing notes reviewed and incorporated, Old chart reviewed, Notes from prior ED visits, and [place] Controlled Substance Database       *VIRGINIA FRANCISCO was evaluated in Emergency Department on 03/23/2021 for the symptoms described in the history of present illness. He was evaluated in the context of the global  COVID-19 pandemic, which necessitated consideration that the patient might be at risk for infection with the SARS-CoV-2 virus that causes COVID-19. Institutional protocols and algorithms that pertain to the evaluation of patients at risk for COVID-19 are in a state of rapid change based on information released by regulatory bodies including the CDC and federal and state organizations. These policies and algorithms were followed during the patient's care in the ED.  Some ED evaluations and interventions may be delayed as a result of limited staffing during the pandemic.*     Medical Decision Making:  60 yo M here with dizziness. Suspect BPPH / peripheral vertigo, likely exacerbated by recent intubation and procedure. Pt sx are reproducible w/ head movement and he demonstrates fatigue-able, horizontal nystagmus with latency. He has no dysphagia, dysarthria, or s/s cerebellar CVA. He did recently have a procedure, and given his age, risk factors, CVA is consideration so MR obtained, reviewed, and shows no evidence of acute CVA. Pt feels markedly improved after benzos in ED. Labs unremarkable with exception of mild hypokalemia. Will treat for peripheral vertigo, d/c with outpt follow-up and ENT referral as needed. Advised flonase to help facilitate ETD/middle ear drainage.  ____________________________________________  FINAL CLINICAL IMPRESSION(S) / ED DIAGNOSES  Final diagnoses:  Vertigo     MEDICATIONS GIVEN DURING THIS VISIT:  Medications  diazepam (VALIUM) tablet 5 mg (5 mg Oral Given 03/23/21 1411)  potassium chloride SA (KLOR-CON) CR tablet 40 mEq (40 mEq Oral Given 03/23/21 1410)  sodium chloride 0.9 % bolus 500 mL (0 mLs Intravenous Stopped 03/23/21 1523)     ED Discharge Orders          Ordered    meclizine (ANTIVERT) 25 MG tablet  3 times daily PRN        03/23/21 1441  diazepam (VALIUM) 5 MG tablet  Every 12 hours PRN        03/23/21 1441    fluticasone (FLONASE) 50 MCG/ACT  nasal spray  Daily        03/23/21 1441             Note:  This document was prepared using Dragon voice recognition software and may include unintentional dictation errors.   Shaune Pollack, MD 03/23/21 [place] Moment

## 2021-03-23 NOTE — ED Notes (Signed)
Patient transported to MRI 

## 2021-03-23 NOTE — ED Notes (Signed)
Pt has hip replacement last Monday,discharged home Tuesday and on Thursday woke up with dizziness, worse with movement and turning his head, having nausea with it. Pt is a/ox4 on arrival

## 2021-06-05 ENCOUNTER — Other Ambulatory Visit: Payer: Self-pay | Admitting: Orthopedic Surgery

## 2021-06-05 ENCOUNTER — Encounter: Payer: Self-pay | Admitting: Orthopedic Surgery

## 2021-06-05 ENCOUNTER — Other Ambulatory Visit: Payer: Self-pay

## 2021-06-05 ENCOUNTER — Other Ambulatory Visit
Admission: RE | Admit: 2021-06-05 | Discharge: 2021-06-05 | Disposition: A | Discharge status: Home / Self Care | Payer: Medicaid Other | Source: Ambulatory Visit | Attending: Orthopedic Surgery | Admitting: Orthopedic Surgery

## 2021-06-05 VITALS — BP 144/87 | HR 95 | Resp 18 | Ht 76.0 in | Wt 330.0 lb

## 2021-06-05 DIAGNOSIS — Z01818 Encounter for other preprocedural examination: Secondary | ICD-10-CM | POA: Insufficient documentation

## 2021-06-05 DIAGNOSIS — Z01812 Encounter for preprocedural laboratory examination: Secondary | ICD-10-CM

## 2021-06-05 LAB — APTT: aPTT: 32 seconds (ref 24–36)

## 2021-06-05 LAB — URINALYSIS, ROUTINE W REFLEX MICROSCOPIC
Bilirubin Urine: NEGATIVE
Glucose, UA: >1000 mg/dL — AB
Hgb urine dipstick: NEGATIVE
Ketones, ur: NEGATIVE mg/dL
Leukocytes,Ua: NEGATIVE
Nitrite: NEGATIVE
Specific Gravity, Urine: 1.01 (ref 1.005–1.030)
pH: 5 (ref 5.0–8.0)

## 2021-06-05 LAB — BASIC METABOLIC PANEL
Anion gap: 8 (ref 5–15)
BUN: 17 mg/dL (ref 6–20)
CO2: 26 mmol/L (ref 22–32)
Calcium: 9.1 mg/dL (ref 8.9–10.3)
Chloride: 102 mmol/L (ref 98–111)
Creatinine, Ser: 0.98 mg/dL (ref 0.61–1.24)
GFR, Estimated: >60 mL/min (ref >60)
Glucose, Bld: 140 mg/dL — ABNORMAL HIGH (ref 70–99)
Potassium: 3.9 mmol/L (ref 3.5–5.1)
Sodium: 136 mmol/L (ref 135–145)

## 2021-06-05 LAB — TYPE AND SCREEN
ABO/RH(D): O POS
Antibody Screen: NEGATIVE

## 2021-06-05 LAB — SURGICAL PCR SCREEN
MRSA, PCR: NEGATIVE
Staphylococcus aureus: NEGATIVE

## 2021-06-05 LAB — URINALYSIS, MICROSCOPIC (REFLEX)
Bacteria, UA: NONE SEEN
RBC / HPF: NONE SEEN RBC/hpf (ref 0–5)

## 2021-06-05 LAB — CBC
HCT: 44.6 % (ref 39.0–52.0)
Hemoglobin: 13.6 g/dL (ref 13.0–17.0)
MCH: 26.7 pg (ref 26.0–34.0)
MCHC: 30.5 g/dL (ref 30.0–36.0)
MCV: 87.6 fL (ref 80.0–100.0)
Platelets: 247 10*3/uL (ref 150–400)
RBC: 5.09 MIL/uL (ref 4.22–5.81)
RDW: 16.7 % — ABNORMAL HIGH (ref 11.5–15.5)
WBC: 4.8 10*3/uL (ref 4.0–10.5)
nRBC: 0 % (ref 0.0–0.2)

## 2021-06-05 LAB — PROTIME-INR
INR: 1.1 (ref 0.8–1.2)
Prothrombin Time: 13.8 seconds (ref 11.4–15.2)

## 2021-06-05 NOTE — Patient Instructions (Addendum)
Your procedure is scheduled on: 06/16/21 - Monday Report to the Registration Desk on the 1st floor of [facility]. To find out your arrival time, please call [phone] between 1PM - 3PM on: 06/13/21 - Friday Report to [facility] for Covid test on 06/12/21 between 8 am and 12 noon.  REMEMBER: Instructions that are not followed completely may result in serious medical risk, up to and including death; or upon the discretion of your surgeon and anesthesiologist your surgery may need to be rescheduled.  Do not eat food after midnight the night before surgery.  No gum chewing, lozengers or hard candies.  You may however, drink CLEAR liquids up to 2 hours before you are scheduled to arrive for your surgery. Do not drink anything within 2 hours of your scheduled arrival time.  Type 1 and Type 2 diabetics should only drink water.  TAKE THESE MEDICATIONS THE MORNING OF SURGERY WITH A SIP OF WATER:  - amLODipine (NORVASC) 10 MG tablet - brimonidine (ALPHAGAN) 0.2 % ophthalmic solution - timolol (TIMOPTIC) 0.5 % ophthalmic solution  Do Not Take dapagliflozin propanediol (FARXIGA) 5 MG TABS tablet on 02/10, 02/11, 02/12, and do not take the day of surgery.  Do Not take metFORMIN (GLUCOPHAGE) 500 MG tablet on 02/11, 02/12, and do not take the day of surgery.  Follow recommendations from Cardiologist, Pulmonologist or PCP regarding stopping Aspirin, Coumadin, Plavix, Eliquis, Pradaxa, or Pletal. Patient reports not taking on 05/29/21.  One week prior to surgery: Stop Anti-inflammatories (NSAIDS) such as Advil, Aleve, Ibuprofen, Motrin, Naproxen, Naprosyn and Aspirin based products such as Excedrin, Goodys Powder, BC Powder.  Stop ANY OVER THE COUNTER supplements until after surgery.  You may however, continue to take Tylenol if needed for pain up until the day of surgery.  No Alcohol for 24 hours before or after surgery.  No Smoking including e-cigarettes for 24 hours prior to  surgery.  No chewable tobacco products for at least 6 hours prior to surgery.  No nicotine patches on the day of surgery.  Do not use any "recreational" drugs for at least a week prior to your surgery.  Please be advised that the combination of cocaine and anesthesia may have negative outcomes, up to and including death. If you test positive for cocaine, your surgery will be cancelled.  On the morning of surgery brush your teeth with toothpaste and water, you may rinse your mouth with mouthwash if you wish. Do not swallow any toothpaste or mouthwash.  Use CHG Soap or wipes as directed on instruction sheet.  Do not wear jewelry, make-up, hairpins, clips or nail polish.  Do not wear lotions, powders, or perfumes.   Do not shave body from the neck down 48 hours prior to surgery just in case you cut yourself which could leave a site for infection.  Also, freshly shaved skin may become irritated if using the CHG soap.  Contact lenses, hearing aids and dentures may not be worn into surgery.  Do not bring valuables to the hospital. [facility] is not responsible for any missing/lost belongings or valuables.   Notify your doctor if there is any change in your medical condition (cold, fever, infection).  Wear comfortable clothing (specific to your surgery type) to the hospital.  After surgery, you can help prevent lung complications by doing breathing exercises.  Take deep breaths and cough every 1-2 hours. Your doctor may order a device called an Incentive Spirometer to help you take deep breaths. When  coughing or sneezing, hold a pillow firmly against your incision with both hands. This is called splinting. Doing this helps protect your incision. It also decreases belly discomfort.  If you are being admitted to the hospital overnight, leave your suitcase in the car. After surgery it may be brought to your room.  If you are being discharged the day of surgery, you will not be allowed to  drive home. You will need a responsible adult (18 years or older) to drive you home and stay with you that night.   If you are taking public transportation, you will need to have a responsible adult (18 years or older) with you. Please confirm with your physician that it is acceptable to use public transportation.   Please call the Pre-admissions Testing Dept. at [phone] if you have any questions about these instructions.  Surgery Visitation Policy:  Patients undergoing a surgery or procedure may have one family member or support person with them as long as that person is not COVID-19 positive or experiencing its symptoms.  That person may remain in the waiting area during the procedure and may rotate out with other people.  Inpatient Visitation:    Visiting hours are 7 a.m. to 8 p.m. Up to two visitors ages 16+ are allowed at one time in a patient room. The visitors may rotate out with other people during the day. Visitors must check out when they leave, or other visitors will not be allowed. One designated support person may remain overnight. The visitor must pass COVID-19 screenings, use hand sanitizer when entering and exiting the patients room and wear a mask at all times, including in the patients room. Patients must also wear a mask when staff or their visitor are in the room. Masking is required regardless of vaccination status.

## 2021-06-05 NOTE — H&P (Signed)
NAME: [name] Jensen MRN:   [id number] DOB:   Oct 24, 1960     HISTORY AND PHYSICAL  CHIEF COMPLAINT:  left hip pain  HISTORY:   [name] Jensen a 61 y.o. male  with left  Hip Pain Patient complains of left hip pain. Onset of the symptoms was several years ago. Inciting event: known DJD. The patient reports the hip pain is worse with weight bearing. Associated symptoms: none. Aggravating symptoms include: any weight bearing. Patient has had prior hip problems. Previous visits for this problem: multiple, this is a longstanding diagnosis. Last seen several weeks ago by Dr. Odis Luster . Evaluation to date: plain films, which were abnormal  osteoarthritis . Treatment to date: OTC analgesics, which have been somewhat effective, prescription analgesics, which have been somewhat effective, home exercise program, which has been somewhat effective, and physical therapy, which has been somewhat effective.    Plan for left total hip replacement  PAST MEDICAL HISTORY:   Past Medical History:  Diagnosis Date   Anxiety    Arthritis    right hip   Depression    Diabetes mellitus without complication (HCC)    Glaucoma    History of chicken pox    History of measles    History of mumps    Hyperlipidemia    Hypertension    Neuropathy     PAST SURGICAL HISTORY:   Past Surgical History:  Procedure Laterality Date   CYSTECTOMY  2003   total: Pilonidal   TOTAL HIP ARTHROPLASTY Right 03/17/2021   Procedure: TOTAL HIP ARTHROPLASTY ANTERIOR APPROACH;  Surgeon: Lyndle Herrlich, MD;  Location: [facility] ORS;  Service: Orthopedics;  Laterality: Right;   VASECTOMY  2003   WISDOM TOOTH EXTRACTION      MEDICATIONS:  (Not in a hospital admission)   ALLERGIES:  No Known Allergies  REVIEW OF SYSTEMS:   Negative except HPI  FAMILY HISTORY:   Family History  Problem Relation Age of Onset   Hypertension Father    Melanoma Father    Glaucoma Father    Osteoporosis Mother    Hiatal hernia Mother    Hypertension  Sister     SOCIAL HISTORY:   reports that he has been smoking cigars. He has quit using smokeless tobacco. He reports current alcohol use. He reports that he does not use drugs.  PHYSICAL EXAM:  General appearance: alert, cooperative, and no distress Neck: no JVD and supple, symmetrical, trachea midline Resp: clear to auscultation bilaterally Cardio: regular rate and rhythm, S1, S2 normal, no murmur, click, rub or gallop GI: soft, non-tender; bowel sounds normal; no masses,  no organomegaly Extremities: extremities normal, atraumatic, no cyanosis or edema and Homans sign is negative, no sign of DVT Pulses: 2+ and symmetric Skin: Skin color, texture, turgor normal. No rashes or lesions    LABORATORY STUDIES: Recent Labs    06/05/21 0957  WBC 4.8  HGB 13.6  HCT 44.6  PLT 247     Recent Labs    06/05/21 0957  NA 136  K 3.9  CL 102  CO2 26  GLUCOSE 140*  BUN 17  CREATININE 0.98  CALCIUM 9.1    STUDIES/RESULTS:  No results found.  ASSESSMENT:  End stage osteoarthritis left hip        Active Problems:   * No active hospital problems. *    PLAN:  Left Primary Total Hip   Altamese Cabal 06/05/2021. 3:09 PM

## 2021-06-12 ENCOUNTER — Other Ambulatory Visit
Admission: RE | Admit: 2021-06-12 | Discharge: 2021-06-12 | Disposition: A | Discharge status: Home / Self Care | Payer: Medicaid Other | Source: Ambulatory Visit | Attending: Orthopedic Surgery | Admitting: Orthopedic Surgery

## 2021-06-12 ENCOUNTER — Other Ambulatory Visit: Payer: Self-pay

## 2021-06-12 DIAGNOSIS — Z01812 Encounter for preprocedural laboratory examination: Secondary | ICD-10-CM | POA: Diagnosis not present

## 2021-06-12 DIAGNOSIS — Z20822 Contact with and (suspected) exposure to covid-19: Secondary | ICD-10-CM | POA: Diagnosis not present

## 2021-06-13 LAB — SARS CORONAVIRUS 2 (TAT 6-24 HRS): SARS Coronavirus 2: NEGATIVE

## 2021-06-16 ENCOUNTER — Encounter: Payer: Self-pay | Admitting: Orthopedic Surgery

## 2021-06-16 ENCOUNTER — Other Ambulatory Visit: Payer: Self-pay

## 2021-06-16 ENCOUNTER — Observation Stay
Admission: RE | Admit: 2021-06-16 | Discharge: 2021-06-17 | Disposition: A | Discharge status: Home / Self Care | Payer: Medicaid Other | Source: Ambulatory Visit | Attending: Orthopedic Surgery | Admitting: Orthopedic Surgery

## 2021-06-16 ENCOUNTER — Ambulatory Visit: Payer: Medicaid Other | Admitting: Urgent Care

## 2021-06-16 ENCOUNTER — Ambulatory Visit: Payer: Medicaid Other

## 2021-06-16 ENCOUNTER — Encounter
Admission: RE | Disposition: A | Discharge status: Home / Self Care | Payer: Self-pay | Source: Ambulatory Visit | Attending: Orthopedic Surgery

## 2021-06-16 ENCOUNTER — Ambulatory Visit: Payer: Medicaid Other | Admitting: Anesthesiology

## 2021-06-16 DIAGNOSIS — E119 Type 2 diabetes mellitus without complications: Secondary | ICD-10-CM | POA: Insufficient documentation

## 2021-06-16 DIAGNOSIS — Z96641 Presence of right artificial hip joint: Secondary | ICD-10-CM | POA: Insufficient documentation

## 2021-06-16 DIAGNOSIS — Z96642 Presence of left artificial hip joint: Secondary | ICD-10-CM

## 2021-06-16 DIAGNOSIS — M1612 Unilateral primary osteoarthritis, left hip: Principal | ICD-10-CM | POA: Insufficient documentation

## 2021-06-16 DIAGNOSIS — Z01818 Encounter for other preprocedural examination: Secondary | ICD-10-CM

## 2021-06-16 DIAGNOSIS — I10 Essential (primary) hypertension: Secondary | ICD-10-CM | POA: Insufficient documentation

## 2021-06-16 DIAGNOSIS — Z419 Encounter for procedure for purposes other than remedying health state, unspecified: Secondary | ICD-10-CM

## 2021-06-16 HISTORY — PX: TOTAL HIP ARTHROPLASTY: SHX124

## 2021-06-16 LAB — GLUCOSE, CAPILLARY
Glucose-Capillary: 149 mg/dL — ABNORMAL HIGH (ref 70–99)
Glucose-Capillary: 136 mg/dL — ABNORMAL HIGH (ref 70–99)
Glucose-Capillary: 156 mg/dL — ABNORMAL HIGH (ref 70–99)
Glucose-Capillary: 182 mg/dL — ABNORMAL HIGH (ref 70–99)

## 2021-06-16 LAB — HEMOGLOBIN A1C
Hgb A1c MFr Bld: 6.1 % — ABNORMAL HIGH (ref 4.8–5.6)
Mean Plasma Glucose: 128.37 mg/dL

## 2021-06-16 SURGERY — ARTHROPLASTY, HIP, TOTAL, ANTERIOR APPROACH
Anesthesia: Spinal | Site: Hip | Laterality: Left

## 2021-06-16 MED ORDER — STERILE WATER FOR IRRIGATION IR SOLN
Status: DC | PRN
Start: 2021-06-16 — End: 2021-06-16
  Administered 2021-06-16: 1000 mL

## 2021-06-16 MED ORDER — METOCLOPRAMIDE HCL 10 MG PO TABS: 5.0000 mg | ORAL_TABLET | Freq: Three times a day (TID) | ORAL | Status: DC | PRN

## 2021-06-16 MED ORDER — DOCUSATE SODIUM 100 MG PO CAPS
100.0000 mg | ORAL_CAPSULE | Freq: Two times a day (BID) | ORAL | Status: DC
Start: 2021-06-16 — End: 2021-06-17
  Administered 2021-06-17: 100 mg via ORAL
  Filled 2021-06-16: qty 1

## 2021-06-16 MED ORDER — CHLORHEXIDINE GLUCONATE 0.12 % MT SOLN
OROMUCOSAL | Status: AC
  Administered 2021-06-16: 15 mL via OROMUCOSAL
  Filled 2021-06-16: qty 15

## 2021-06-16 MED ORDER — MIDAZOLAM HCL 5 MG/5ML IJ SOLN
INTRAMUSCULAR | Status: DC | PRN
  Administered 2021-06-16 (×2): 1 mg via INTRAVENOUS

## 2021-06-16 MED ORDER — OXYCODONE HCL 5 MG/5ML PO SOLN: 5.0000 mg | Freq: Once | ORAL | Status: AC | PRN

## 2021-06-16 MED ORDER — 0.9 % SODIUM CHLORIDE (POUR BTL) OPTIME
TOPICAL | Status: DC | PRN
  Administered 2021-06-16: 500 mL

## 2021-06-16 MED ORDER — ONDANSETRON HCL 4 MG/2ML IJ SOLN
INTRAMUSCULAR | Status: DC | PRN
  Administered 2021-06-16: 4 mg via INTRAVENOUS

## 2021-06-16 MED ORDER — ASPIRIN 81 MG PO CHEW: 81.0000 mg | CHEWABLE_TABLET | Freq: Two times a day (BID) | ORAL | Status: DC

## 2021-06-16 MED ORDER — LISINOPRIL-HYDROCHLOROTHIAZIDE 20-25 MG PO TABS: 1.0000 | ORAL_TABLET | Freq: Every day | ORAL | Status: DC

## 2021-06-16 MED ORDER — ACETAMINOPHEN 325 MG PO TABS
325.0000 mg | ORAL_TABLET | Freq: Four times a day (QID) | ORAL | Status: DC | PRN
  Administered 2021-06-17: 650 mg via ORAL

## 2021-06-16 MED ORDER — PROPOFOL 1000 MG/100ML IV EMUL
INTRAVENOUS | Status: AC
  Filled 2021-06-16: qty 100

## 2021-06-16 MED ORDER — LACTATED RINGERS IV SOLN: INTRAVENOUS | Status: DC

## 2021-06-16 MED ORDER — DIAZEPAM 5 MG PO TABS: 5.0000 mg | ORAL_TABLET | Freq: Two times a day (BID) | ORAL | Status: DC | PRN

## 2021-06-16 MED ORDER — KETOROLAC TROMETHAMINE 15 MG/ML IJ SOLN
15.0000 mg | Freq: Four times a day (QID) | INTRAMUSCULAR | Status: AC
  Administered 2021-06-16 – 2021-06-17 (×2): 15 mg via INTRAVENOUS

## 2021-06-16 MED ORDER — FLUTICASONE PROPIONATE 50 MCG/ACT NA SUSP
1.0000 | Freq: Every day | NASAL | Status: DC
  Administered 2021-06-17: 1 via NASAL
  Filled 2021-06-16: qty 16

## 2021-06-16 MED ORDER — KETOROLAC TROMETHAMINE 30 MG/ML IJ SOLN
INTRAMUSCULAR | Status: AC
  Filled 2021-06-16: qty 1

## 2021-06-16 MED ORDER — LATANOPROST 0.005 % OP SOLN
1.0000 [drp] | Freq: Every day | OPHTHALMIC | Status: DC
  Administered 2021-06-17: 1 [drp] via OPHTHALMIC
  Filled 2021-06-16: qty 2.5

## 2021-06-16 MED ORDER — PHENYLEPHRINE HCL-NACL 20-0.9 MG/250ML-% IV SOLN
INTRAVENOUS | Status: AC
  Filled 2021-06-16: qty 250

## 2021-06-16 MED ORDER — ACETAMINOPHEN 10 MG/ML IV SOLN: 1000.0000 mg | Freq: Once | INTRAVENOUS | Status: DC | PRN

## 2021-06-16 MED ORDER — GLYCOPYRROLATE 0.2 MG/ML IJ SOLN
INTRAMUSCULAR | Status: DC | PRN
Start: 2021-06-16 — End: 2021-06-16
  Administered 2021-06-16: .2 mg via INTRAVENOUS

## 2021-06-16 MED ORDER — PRONTOSAN WOUND IRRIGATION OPTIME
TOPICAL | Status: DC | PRN
  Administered 2021-06-16: 1 via TOPICAL

## 2021-06-16 MED ORDER — BRIMONIDINE TARTRATE 0.2 % OP SOLN
1.0000 [drp] | Freq: Two times a day (BID) | OPHTHALMIC | Status: DC
  Administered 2021-06-16 – 2021-06-17 (×2): 1 [drp] via OPHTHALMIC
  Filled 2021-06-16: qty 5

## 2021-06-16 MED ORDER — LIDOCAINE HCL (CARDIAC) PF 100 MG/5ML IV SOSY
PREFILLED_SYRINGE | INTRAVENOUS | Status: DC | PRN
  Administered 2021-06-16: 50 mg via INTRAVENOUS

## 2021-06-16 MED ORDER — OXYCODONE HCL 5 MG PO TABS
ORAL_TABLET | ORAL | Status: AC
  Filled 2021-06-16: qty 2

## 2021-06-16 MED ORDER — INSULIN ASPART 100 UNIT/ML IJ SOLN
0.0000 [IU] | Freq: Three times a day (TID) | INTRAMUSCULAR | Status: DC
  Administered 2021-06-16: 3 [IU] via SUBCUTANEOUS

## 2021-06-16 MED ORDER — HYDROMORPHONE HCL 1 MG/ML IJ SOLN: 0.5000 mg | INTRAMUSCULAR | Status: DC | PRN

## 2021-06-16 MED ORDER — BUPIVACAINE HCL (PF) 0.5 % IJ SOLN
INTRAMUSCULAR | Status: AC
  Filled 2021-06-16: qty 10

## 2021-06-16 MED ORDER — INSULIN ASPART 100 UNIT/ML IJ SOLN
INTRAMUSCULAR | Status: AC
  Administered 2021-06-16: 3 [IU]
  Filled 2021-06-16: qty 1

## 2021-06-16 MED ORDER — PHENOL 1.4 % MT LIQD
1.0000 | OROMUCOSAL | Status: DC | PRN
  Filled 2021-06-16: qty 177

## 2021-06-16 MED ORDER — DEXAMETHASONE SODIUM PHOSPHATE 10 MG/ML IJ SOLN
INTRAMUSCULAR | Status: DC | PRN
  Administered 2021-06-16: 5 mg via INTRAVENOUS

## 2021-06-16 MED ORDER — CEFAZOLIN SODIUM-DEXTROSE 2-4 GM/100ML-% IV SOLN
INTRAVENOUS | Status: AC
  Administered 2021-06-16: 2 g via INTRAVENOUS
  Filled 2021-06-16: qty 100

## 2021-06-16 MED ORDER — PHENYLEPHRINE 40 MCG/ML (10ML) SYRINGE FOR IV PUSH (FOR BLOOD PRESSURE SUPPORT)
PREFILLED_SYRINGE | INTRAVENOUS | Status: DC | PRN
  Administered 2021-06-16 (×2): 80 ug via INTRAVENOUS
  Administered 2021-06-16 (×2): 160 ug via INTRAVENOUS
  Administered 2021-06-16: 80 ug via INTRAVENOUS

## 2021-06-16 MED ORDER — KETOROLAC TROMETHAMINE 15 MG/ML IJ SOLN
INTRAMUSCULAR | Status: AC
  Administered 2021-06-16: 15 mg via INTRAVENOUS
  Filled 2021-06-16: qty 1

## 2021-06-16 MED ORDER — DOCUSATE SODIUM 100 MG PO CAPS
ORAL_CAPSULE | ORAL | Status: AC
  Administered 2021-06-16: 100 mg via ORAL
  Filled 2021-06-16: qty 1

## 2021-06-16 MED ORDER — ORAL CARE MOUTH RINSE: 15.0000 mL | Freq: Once | OROMUCOSAL | Status: AC

## 2021-06-16 MED ORDER — EPHEDRINE SULFATE (PRESSORS) 50 MG/ML IJ SOLN
INTRAMUSCULAR | Status: DC | PRN
  Administered 2021-06-16 (×5): 5 mg via INTRAVENOUS

## 2021-06-16 MED ORDER — BUPIVACAINE-EPINEPHRINE (PF) 0.25% -1:200000 IJ SOLN
INTRAMUSCULAR | Status: DC | PRN
  Administered 2021-06-16: 30 mL

## 2021-06-16 MED ORDER — MENTHOL 3 MG MT LOZG
1.0000 | LOZENGE | OROMUCOSAL | Status: DC | PRN
  Filled 2021-06-16: qty 9

## 2021-06-16 MED ORDER — METHOCARBAMOL 500 MG PO TABS: 500.0000 mg | ORAL_TABLET | Freq: Four times a day (QID) | ORAL | Status: DC | PRN

## 2021-06-16 MED ORDER — METFORMIN HCL 500 MG PO TABS
500.0000 mg | ORAL_TABLET | Freq: Three times a day (TID) | ORAL | Status: DC
  Administered 2021-06-16 – 2021-06-17 (×4): 500 mg via ORAL
  Filled 2021-06-16 (×7): qty 1

## 2021-06-16 MED ORDER — OXYCODONE HCL 5 MG PO TABS
ORAL_TABLET | ORAL | Status: AC
  Administered 2021-06-16: 5 mg via ORAL
  Filled 2021-06-16: qty 1

## 2021-06-16 MED ORDER — MIDAZOLAM HCL 2 MG/2ML IJ SOLN
INTRAMUSCULAR | Status: AC
  Filled 2021-06-16: qty 2

## 2021-06-16 MED ORDER — LISINOPRIL 20 MG PO TABS
20.0000 mg | ORAL_TABLET | Freq: Every day | ORAL | Status: DC
  Administered 2021-06-17: 20 mg via ORAL
  Filled 2021-06-16: qty 1

## 2021-06-16 MED ORDER — FAMOTIDINE 20 MG PO TABS
ORAL_TABLET | ORAL | Status: AC
  Administered 2021-06-16: 20 mg via ORAL
  Filled 2021-06-16: qty 1

## 2021-06-16 MED ORDER — FENTANYL CITRATE (PF) 100 MCG/2ML IJ SOLN
INTRAMUSCULAR | Status: AC
  Filled 2021-06-16: qty 2

## 2021-06-16 MED ORDER — OXYCODONE HCL 5 MG PO TABS
5.0000 mg | ORAL_TABLET | ORAL | Status: DC | PRN
  Administered 2021-06-16 – 2021-06-17 (×5): 10 mg via ORAL

## 2021-06-16 MED ORDER — TRANEXAMIC ACID-NACL 1000-0.7 MG/100ML-% IV SOLN
1000.0000 mg | INTRAVENOUS | Status: AC
  Administered 2021-06-16: 1000 mg via INTRAVENOUS

## 2021-06-16 MED ORDER — EPHEDRINE 5 MG/ML INJ
INTRAVENOUS | Status: AC
  Filled 2021-06-16: qty 5

## 2021-06-16 MED ORDER — LIDOCAINE HCL (PF) 2 % IJ SOLN
INTRAMUSCULAR | Status: AC
  Filled 2021-06-16: qty 5

## 2021-06-16 MED ORDER — BISACODYL 10 MG RE SUPP
10.0000 mg | Freq: Every day | RECTAL | Status: DC | PRN
  Filled 2021-06-16: qty 1

## 2021-06-16 MED ORDER — ACETAMINOPHEN 10 MG/ML IV SOLN
INTRAVENOUS | Status: DC | PRN
Start: 2021-06-16 — End: 2021-06-16
  Administered 2021-06-16: 1000 mg via INTRAVENOUS

## 2021-06-16 MED ORDER — SODIUM CHLORIDE 0.9 % IR SOLN
Status: DC | PRN
  Administered 2021-06-16: 3000 mL

## 2021-06-16 MED ORDER — FENTANYL CITRATE (PF) 100 MCG/2ML IJ SOLN
INTRAMUSCULAR | Status: DC | PRN
  Administered 2021-06-16 (×2): 25 ug via INTRAVENOUS

## 2021-06-16 MED ORDER — FAMOTIDINE 20 MG PO TABS: 20.0000 mg | ORAL_TABLET | Freq: Once | ORAL | Status: AC

## 2021-06-16 MED ORDER — OXYCODONE HCL 5 MG PO TABS: 5.0000 mg | ORAL_TABLET | Freq: Once | ORAL | Status: AC | PRN

## 2021-06-16 MED ORDER — DEXMEDETOMIDINE (PRECEDEX) IN NS 20 MCG/5ML (4 MCG/ML) IV SYRINGE
PREFILLED_SYRINGE | INTRAVENOUS | Status: DC | PRN
  Administered 2021-06-16: 6 ug via INTRAVENOUS
  Administered 2021-06-16: 10 ug via INTRAVENOUS

## 2021-06-16 MED ORDER — TRANEXAMIC ACID-NACL 1000-0.7 MG/100ML-% IV SOLN
INTRAVENOUS | Status: AC
  Filled 2021-06-16: qty 100

## 2021-06-16 MED ORDER — PROMETHAZINE HCL 25 MG/ML IJ SOLN: 6.2500 mg | INTRAMUSCULAR | Status: DC | PRN

## 2021-06-16 MED ORDER — CEFAZOLIN SODIUM-DEXTROSE 2-4 GM/100ML-% IV SOLN
2.0000 g | INTRAVENOUS | Status: AC
  Administered 2021-06-16: 2 g via INTRAVENOUS
  Administered 2021-06-16: 1 g via INTRAVENOUS

## 2021-06-16 MED ORDER — SODIUM CHLORIDE 0.9 % IV SOLN: INTRAVENOUS | Status: DC

## 2021-06-16 MED ORDER — FENTANYL CITRATE (PF) 100 MCG/2ML IJ SOLN: 25.0000 ug | INTRAMUSCULAR | Status: DC | PRN

## 2021-06-16 MED ORDER — HYDROCHLOROTHIAZIDE 25 MG PO TABS
25.0000 mg | ORAL_TABLET | Freq: Every day | ORAL | Status: DC
  Administered 2021-06-17: 25 mg via ORAL
  Filled 2021-06-16: qty 1

## 2021-06-16 MED ORDER — MELATONIN 5 MG PO TABS
20.0000 mg | ORAL_TABLET | Freq: Every day | ORAL | Status: DC
  Administered 2021-06-16: 20 mg via ORAL
  Filled 2021-06-16 (×2): qty 4

## 2021-06-16 MED ORDER — ONDANSETRON HCL 4 MG/2ML IJ SOLN
INTRAMUSCULAR | Status: AC
  Filled 2021-06-16: qty 2

## 2021-06-16 MED ORDER — METHOCARBAMOL 1000 MG/10ML IJ SOLN
500.0000 mg | Freq: Four times a day (QID) | INTRAVENOUS | Status: DC | PRN
  Filled 2021-06-16: qty 5

## 2021-06-16 MED ORDER — BUPIVACAINE HCL (PF) 0.5 % IJ SOLN: INTRAMUSCULAR | Status: DC | PRN

## 2021-06-16 MED ORDER — CEFAZOLIN SODIUM-DEXTROSE 2-4 GM/100ML-% IV SOLN: 2.0000 g | Freq: Four times a day (QID) | INTRAVENOUS | Status: AC

## 2021-06-16 MED ORDER — SODIUM CHLORIDE 0.9 % IR SOLN
Status: DC | PRN
  Administered 2021-06-16: 250 mL

## 2021-06-16 MED ORDER — ONDANSETRON HCL 4 MG/2ML IJ SOLN: 4.0000 mg | Freq: Four times a day (QID) | INTRAMUSCULAR | Status: DC | PRN

## 2021-06-16 MED ORDER — BUPIVACAINE HCL (PF) 0.5 % IJ SOLN
INTRAMUSCULAR | Status: DC | PRN
  Administered 2021-06-16: 2.8 mL via INTRATHECAL

## 2021-06-16 MED ORDER — PROPOFOL 500 MG/50ML IV EMUL
INTRAVENOUS | Status: DC | PRN
  Administered 2021-06-16: 150 ug/kg/min via INTRAVENOUS

## 2021-06-16 MED ORDER — BUPIVACAINE-EPINEPHRINE (PF) 0.25% -1:200000 IJ SOLN
INTRAMUSCULAR | Status: AC
  Filled 2021-06-16: qty 30

## 2021-06-16 MED ORDER — POVIDONE-IODINE 10 % EX SWAB
2.0000 "application " | Freq: Once | CUTANEOUS | Status: AC
  Administered 2021-06-16: 2 via TOPICAL

## 2021-06-16 MED ORDER — METOCLOPRAMIDE HCL 5 MG/ML IJ SOLN: 5.0000 mg | Freq: Three times a day (TID) | INTRAMUSCULAR | Status: DC | PRN

## 2021-06-16 MED ORDER — DIPHENHYDRAMINE HCL 12.5 MG/5ML PO ELIX
12.5000 mg | ORAL_SOLUTION | ORAL | Status: DC | PRN
  Filled 2021-06-16: qty 10

## 2021-06-16 MED ORDER — PHENYLEPHRINE HCL-NACL 20-0.9 MG/250ML-% IV SOLN
INTRAVENOUS | Status: DC | PRN
Start: 2021-06-16 — End: 2021-06-16
  Administered 2021-06-16: 45 ug/min via INTRAVENOUS

## 2021-06-16 MED ORDER — DEXAMETHASONE SODIUM PHOSPHATE 10 MG/ML IJ SOLN
INTRAMUSCULAR | Status: AC
  Filled 2021-06-16: qty 1

## 2021-06-16 MED ORDER — ALUM & MAG HYDROXIDE-SIMETH 200-200-20 MG/5ML PO SUSP: 30.0000 mL | ORAL | Status: DC | PRN

## 2021-06-16 MED ORDER — CEFAZOLIN SODIUM-DEXTROSE 2-4 GM/100ML-% IV SOLN
INTRAVENOUS | Status: AC
  Filled 2021-06-16: qty 100

## 2021-06-16 MED ORDER — OXYCODONE HCL 5 MG PO TABS: 10.0000 mg | ORAL_TABLET | ORAL | Status: DC | PRN

## 2021-06-16 MED ORDER — ONDANSETRON HCL 4 MG PO TABS: 4.0000 mg | ORAL_TABLET | Freq: Four times a day (QID) | ORAL | Status: DC | PRN

## 2021-06-16 MED ORDER — CHLORHEXIDINE GLUCONATE 0.12 % MT SOLN: 15.0000 mL | Freq: Once | OROMUCOSAL | Status: AC

## 2021-06-16 MED ORDER — DAPAGLIFLOZIN PROPANEDIOL 5 MG PO TABS
10.0000 mg | ORAL_TABLET | Freq: Every morning | ORAL | Status: DC
  Administered 2021-06-17: 10 mg via ORAL
  Filled 2021-06-16 (×2): qty 2
  Filled 2021-06-16 (×2): qty 1

## 2021-06-16 MED ORDER — ASPIRIN 81 MG PO CHEW
CHEWABLE_TABLET | ORAL | Status: AC
  Administered 2021-06-16: 81 mg via ORAL
  Filled 2021-06-16: qty 1

## 2021-06-16 MED ORDER — ACETAMINOPHEN 10 MG/ML IV SOLN
INTRAVENOUS | Status: AC
  Filled 2021-06-16: qty 100

## 2021-06-16 MED ORDER — FUROSEMIDE 20 MG PO TABS
20.0000 mg | ORAL_TABLET | Freq: Every morning | ORAL | Status: DC
  Administered 2021-06-17: 20 mg via ORAL
  Filled 2021-06-16 (×2): qty 1

## 2021-06-16 MED ORDER — MECLIZINE HCL 25 MG PO TABS
25.0000 mg | ORAL_TABLET | Freq: Three times a day (TID) | ORAL | Status: DC | PRN
  Filled 2021-06-16: qty 1

## 2021-06-16 MED ORDER — TIMOLOL MALEATE 0.5 % OP SOLN
1.0000 [drp] | Freq: Two times a day (BID) | OPHTHALMIC | Status: DC
  Administered 2021-06-16 – 2021-06-17 (×2): 1 [drp] via OPHTHALMIC
  Filled 2021-06-16: qty 5

## 2021-06-16 MED ORDER — MAGNESIUM HYDROXIDE 400 MG/5ML PO SUSP: 30.0000 mL | Freq: Every day | ORAL | Status: DC | PRN

## 2021-06-16 MED ORDER — KETOROLAC TROMETHAMINE 15 MG/ML IJ SOLN
INTRAMUSCULAR | Status: AC
  Filled 2021-06-16: qty 1

## 2021-06-16 MED ORDER — AMLODIPINE BESYLATE 10 MG PO TABS
10.0000 mg | ORAL_TABLET | ORAL | Status: DC
  Administered 2021-06-17: 10 mg via ORAL
  Filled 2021-06-16 (×2): qty 1

## 2021-06-16 MED ORDER — PROPOFOL 10 MG/ML IV BOLUS
INTRAVENOUS | Status: DC | PRN
  Administered 2021-06-16: 40 mg via INTRAVENOUS

## 2021-06-16 MED ORDER — GLIMEPIRIDE 4 MG PO TABS
4.0000 mg | ORAL_TABLET | Freq: Every day | ORAL | Status: DC
  Administered 2021-06-17: 4 mg via ORAL
  Filled 2021-06-16: qty 1

## 2021-06-16 SURGICAL SUPPLY — 60 items
BLADE SAGITTAL WIDE XTHICK NO (BLADE) ×2 IMPLANT
BNDG COHESIVE 4X5 TAN ST LF (GAUZE/BANDAGES/DRESSINGS) ×4 IMPLANT
BRUSH SCRUB EZ  4% CHG (MISCELLANEOUS) ×1
BRUSH SCRUB EZ 4% CHG (MISCELLANEOUS) ×1 IMPLANT
CHLORAPREP W/TINT 26 (MISCELLANEOUS) ×2 IMPLANT
COVER HOLE (Hips) ×1 IMPLANT
CUP R3 54MM 3 HOLE (Hips) ×1 IMPLANT
DRAPE 3/4 80X56 (DRAPES) ×2 IMPLANT
DRAPE C-ARM 42X72 X-RAY (DRAPES) ×2 IMPLANT
DRAPE STERI IOBAN 125X83 (DRAPES) IMPLANT
DRAPE U-SHAPE 47X51 STRL (DRAPES) ×2 IMPLANT
DRSG AQUACEL AG ADV 3.5X10 (GAUZE/BANDAGES/DRESSINGS) ×1 IMPLANT
DRSG AQUACEL AG ADV 3.5X14 (GAUZE/BANDAGES/DRESSINGS) IMPLANT
ELECT REM PT RETURN 9FT ADLT (ELECTROSURGICAL) ×2
ELECTRODE REM PT RTRN 9FT ADLT (ELECTROSURGICAL) ×1 IMPLANT
GAUZE 4X4 16PLY ~~LOC~~+RFID DBL (SPONGE) ×2 IMPLANT
GAUZE XEROFORM 1X8 LF (GAUZE/BANDAGES/DRESSINGS) IMPLANT
GLOVE SURG ORTHO LTX SZ8.5 (GLOVE) ×4 IMPLANT
GLOVE SURG SYN 8.0 (GLOVE) ×4 IMPLANT
GLOVE SURG SYN 8.0 PF PI (GLOVE) ×2 IMPLANT
GLOVE SURG UNDER POLY LF SZ8.5 (GLOVE) ×4 IMPLANT
GOWN STRL REUS W/ TWL LRG LVL3 (GOWN DISPOSABLE) ×1 IMPLANT
GOWN STRL REUS W/ TWL XL LVL3 (GOWN DISPOSABLE) ×2 IMPLANT
GOWN STRL REUS W/TWL LRG LVL3 (GOWN DISPOSABLE) ×1
GOWN STRL REUS W/TWL XL LVL3 (GOWN DISPOSABLE) ×2
HEAD FEMORAL TAPER 36MM P0 (Head) ×1 IMPLANT
HOLSTER ELECTROSUGICAL PENCIL (MISCELLANEOUS) ×2 IMPLANT
IV NS 250ML (IV SOLUTION) ×1
IV NS 250ML BAXH (IV SOLUTION) IMPLANT
IV NS IRRIG 3000ML ARTHROMATIC (IV SOLUTION) ×2 IMPLANT
KIT PATIENT CARE HANA TABLE (KITS) ×2 IMPLANT
KIT TURNOVER CYSTO (KITS) ×2 IMPLANT
LINER ACETABULAR 36X54 OD (Liner) ×1 IMPLANT
MANIFOLD NEPTUNE II (INSTRUMENTS) ×2 IMPLANT
MAT ABSORB  FLUID 56X50 GRAY (MISCELLANEOUS) ×1
MAT ABSORB FLUID 56X50 GRAY (MISCELLANEOUS) ×1 IMPLANT
NDL SAFETY ECLIPSE 18X1.5 (NEEDLE) IMPLANT
NDL SPNL 20GX3.5 QUINCKE YW (NEEDLE) ×1 IMPLANT
NEEDLE HYPO 18GX1.5 SHARP (NEEDLE)
NEEDLE HYPO 22GX1.5 SAFETY (NEEDLE) ×2 IMPLANT
NEEDLE SPNL 20GX3.5 QUINCKE YW (NEEDLE) ×2 IMPLANT
NS IRRIG 500ML POUR BTL (IV SOLUTION) ×1 IMPLANT
PACK HIP PROSTHESIS (MISCELLANEOUS) ×2 IMPLANT
PADDING CAST BLEND 4X4 NS (MISCELLANEOUS) ×4 IMPLANT
PILLOW ABDUCTION MEDIUM (MISCELLANEOUS) ×2 IMPLANT
PULSAVAC PLUS IRRIG FAN TIP (DISPOSABLE) ×2
SCREW 6.5X25MM (Screw) ×1 IMPLANT
SOLUTION PRONTOSAN WOUND 350ML (IRRIGATION / IRRIGATOR) ×2 IMPLANT
SPONGE T-LAP 18X18 ~~LOC~~+RFID (SPONGE) ×8 IMPLANT
STAPLER SKIN PROX 35W (STAPLE) ×2 IMPLANT
STEM LATERAL W/COLLAR SZ LAT4 (Stem) ×1 IMPLANT
SUT BONE WAX W31G (SUTURE) ×2 IMPLANT
SUT DVC 2 QUILL PDO  T11 36X36 (SUTURE) ×1
SUT DVC 2 QUILL PDO T11 36X36 (SUTURE) ×1 IMPLANT
SUT VIC AB 2-0 CT1 18 (SUTURE) ×2 IMPLANT
SYR 20ML LL LF (SYRINGE) ×2 IMPLANT
TIP FAN IRRIG PULSAVAC PLUS (DISPOSABLE) ×1 IMPLANT
WAND WEREWOLF FASTSEAL 6.0 (MISCELLANEOUS) ×2 IMPLANT
WATER STERILE IRR 1000ML POUR (IV SOLUTION) ×1 IMPLANT
WATER STERILE IRR 500ML POUR (IV SOLUTION) ×1 IMPLANT

## 2021-06-16 NOTE — Anesthesia Preprocedure Evaluation (Addendum)
Anesthesia Evaluation  Patient identified by MRN, date of birth, ID band Patient awake    Reviewed: Allergy & Precautions, NPO status , Patient's Chart, lab work & pertinent test results  Airway Mallampati: III  TM Distance: >3 FB Neck ROM: Full    Dental  (+) Poor Dentition, Chipped, Missing,    Pulmonary Current Smoker and Patient abstained from smoking.,    Pulmonary exam normal        Cardiovascular hypertension, Pt. on medications Normal cardiovascular exam     Neuro/Psych PSYCHIATRIC DISORDERS Anxiety Depression  Neuromuscular disease (Pt has neuropathy of bilateral lower extremities with severe numbness in feet. Pt has left foot flexion weakness.) negative psych ROS   GI/Hepatic negative GI ROS, Neg liver ROS,   Endo/Other  diabetes (A1c 6.4), Well Controlled, Type 2, Oral Hypoglycemic AgentsMorbid obesity  Renal/GU Renal disease  negative genitourinary   Musculoskeletal negative musculoskeletal ROS (+) Arthritis , Osteoarthritis,    Abdominal (+) + obese,   Peds negative pediatric ROS (+)  Hematology negative hematology ROS (+)   Anesthesia Other Findings Pt was seen in the ED a few days after his last hip replacement with complaints of dizziness. A stroke work up was negative. Labs not significant. EKG reviewed. The presumed diagnosis with Vertigo.     Anxiety    Arthritis    Depression    Diabetes mellitus without complication (HCC)    Glaucoma    History of chicken pox    History of measles    History of mumps    Hyperlipidemia    Hypertension    Neuropathy       Reproductive/Obstetrics negative OB ROS                        Anesthesia Physical  Anesthesia Plan  ASA: 3  Anesthesia Plan: Spinal   Post-op Pain Management:    Induction: Intravenous  PONV Risk Score and Plan: 2 and Propofol infusion  Airway Management Planned: Simple Face Mask  Additional Equipment:    Intra-op Plan:   Post-operative Plan:   Informed Consent: I have reviewed the patients History and Physical, chart, labs and discussed the procedure including the risks, benefits and alternatives for the proposed anesthesia with the patient or authorized representative who has indicated his/her understanding and acceptance.       Plan Discussed with: CRNA, Anesthesiologist and Surgeon  Anesthesia Plan Comments:        Anesthesia Quick Evaluation

## 2021-06-16 NOTE — H&P (Signed)
The patient has been re-examined, and the chart reviewed, and there have been no interval changes to the documented history and physical.  Plan a left total hip today.  Anesthesia is not consulted regarding a peripheral nerve block for post-operative pain.  The risks, benefits, and alternatives have been discussed at length, and the patient is willing to proceed.    

## 2021-06-16 NOTE — Transfer of Care (Signed)
Immediate Anesthesia Transfer of Care Note  Patient: [name] Jensen  Procedure(s) Performed: TOTAL HIP ARTHROPLASTY ANTERIOR APPROACH (Left: Hip)  Patient Location: PACU  Anesthesia Type:Spinal and GA combined with regional for post-op pain  Level of Consciousness: awake  Airway & Oxygen Therapy: Patient Spontanous Breathing and Patient connected to face mask oxygen  Post-op Assessment: Report given to RN and Post -op Vital signs reviewed and stable  Post vital signs: Reviewed and stable  Last Vitals:  Vitals Value Taken Time  BP 108/71 06/16/21 1000  Temp    Pulse 79 06/16/21 1003  Resp 23 06/16/21 1004  SpO2 100 % 06/16/21 1003  Vitals shown include unvalidated device data.  Last Pain:  Vitals:   06/16/21 0643  PainSc: 0-No pain         Complications: No notable events documented.

## 2021-06-16 NOTE — Anesthesia Procedure Notes (Signed)
Spinal  Patient location during procedure: OR Start time: 06/16/2021 7:40 AM End time: 06/16/2021 7:40 AM Reason for block: surgical anesthesia Staffing Performed: anesthesiologist  Anesthesiologist: Foye Deer, MD Preanesthetic Checklist Completed: patient identified, IV checked, site marked, risks and benefits discussed, surgical consent, monitors and equipment checked, pre-op evaluation and timeout performed Spinal Block Patient position: sitting Prep: DuraPrep Patient monitoring: heart rate, cardiac monitor, continuous pulse ox and blood pressure Approach: midline Location: L2-3 Injection technique: single-shot Needle Needle type: Sprotte  Needle gauge: 24 G Needle length: 9 cm Assessment Sensory level: T10 Events: CSF return Additional Notes Aborted first attempt at L3-4 likely due to inadequate needle length. No paresthesia's occurred.

## 2021-06-16 NOTE — Op Note (Signed)
06/16/2021  9:54 AM  PATIENT:  [name] Jensen   MRN: [id number]  PRE-OPERATIVE DIAGNOSIS:  Osteoarthritis Left hip   POST-OPERATIVE DIAGNOSIS: Same  Procedure: Left Total Hip Replacement  Surgeon: Dola Argyle. Odis Luster, MD   Assist: Altamese Cabal, PA-C  Anesthesia: Spinal   EBL: 300 mL   Specimens: None   Drains: None   Components used: A size 4 lateral Polarstem [employer], R3 size 54 mm shell, and a 36 mm +0 mm head    Description of the procedure in detail: After informed consent was obtained and the appropriate extremity marked in the pre-operative holding area, the patient was taken to the operating room and placed in the supine position on the fracture table. All pressure points were well padded and bilateral lower extremities were place in traction spars. The hip was prepped and draped in standard sterile fashion. A spinal anesthetic had been delivered by the anesthesia team. The skin and subcutaneous tissues were injected with a mixture of Marcaine with epinephrine for post-operative pain. A longitudinal incision approximately 10 cm in length was carried out from the anterior superior iliac spine to the greater trochanter. The tensor fascia was divided and blunt dissection was taken down to the level of the joint capsule. The lateral circumflex vessels were cauterized. Deep retractors were placed and a portion of the anterior capsule was excised. Using fluoroscopy the neck cut was planned and carried out with a sagittal saw. The head was passed from the field with use of a corkscrew and hip skid. Deep retractors were placed along the acetabulum and the degenerative labrum and large osteophytes were removed with a Rongeur. The cup was sequentially reamed to a size 54 mm. The wound was irrigated and using fluoroscopy the size 54 mm cup was impacted in to anatomic position. A single screw was placed followed by a threaded hole cover. The final liner was impacted in to position.  Attention was then turned to the proximal femur. The leg was placed in extension and external rotation. The canal was opened and sequentially broached to a size 4 lateral. The trial components were placed and the hip relocated. The components were found to be in good position using fluoroscopy. The hip was dislocated and the trial components removed. The final components were impacted in to position and the hip relocated. The final components were again check with fluoroscopy and found to be in good position. Hemostasis was achieved with electrocautery. The deep capsule was injected with Marcaine and epinephrine. The wound was irrigated with bacitracin laced normal saline and the tensor fascia closed with #2 Quill suture. The subcutaneous tissues were closed with 2-0 vicryl and staples for the skin. A sterile dressing was applied and an abduction pillow. Patient tolerated the procedure well and there were no apparent complication. Patient was taken to the recovery room in good condition.   Cassell Smiles, MD

## 2021-06-16 NOTE — Anesthesia Postprocedure Evaluation (Signed)
Anesthesia Post Note  Patient: [name] Jensen  Procedure(s) Performed: TOTAL HIP ARTHROPLASTY ANTERIOR APPROACH (Left: Hip)  Patient location during evaluation: PACU Anesthesia Type: Spinal Level of consciousness: awake and alert Pain management: pain level controlled Vital Signs Assessment: post-procedure vital signs reviewed and stable Respiratory status: spontaneous breathing, nonlabored ventilation and respiratory function stable Cardiovascular status: blood pressure returned to baseline and stable Postop Assessment: no apparent nausea or vomiting Anesthetic complications: no   No notable events documented.   Last Vitals:  Vitals:   06/16/21 1157 06/16/21 1210  BP: (!) 155/92 (!) 178/101  Pulse: 78 81  Resp: (!) 23 20  Temp:  (!) 36.1 C  SpO2: 97% 96%    Last Pain:  Vitals:   06/16/21 1210  TempSrc: Temporal  PainSc:                  Foye Deer

## 2021-06-16 NOTE — Evaluation (Signed)
Physical Therapy Evaluation Patient Details Name: ETAI COPADO MRN: [id number] DOB: 05/01/1961 Today's Date: 06/16/2021  History of Present Illness  Pt is a 61 yo male s/p L THA. PMH of HTN, anxiety, depression, DMII, R THA.  Clinical Impression  Patient alert, oriented x4, reported 5-6/10 L hip pain. The patient reported at baseline he is independent, and that he has been doing well since his last THA.   The patient was able to perform supine exercises with minimal verbal cues. Supine to sit with supervision. Sit <> stand with RW and supervision as well. Pt reported a history of vertigo, but had no mention of dizziness throughout session, but extended time in standing, as well as some sideways steps performed prior to ambulation. He ambulated ~59ft with RW CGA-supervision. He was able to progress to step through gait pattern naturally. Returned to bed for now, but agreeable to transfer to a recliner for dinner.  Overall the patient demonstrated deficits (see "PT Problem List") that impede the patient's functional abilities, safety, and mobility and would benefit from skilled PT intervention. Recommendation at this time is outpatient PT to return to [facility] and maximize outcomes.        Recommendations for follow up therapy are one component of a multi-disciplinary discharge planning process, led by the attending physician.  Recommendations may be updated based on patient status, additional functional criteria and insurance authorization.  Follow Up Recommendations Outpatient PT    Assistance Recommended at Discharge Intermittent Supervision/Assistance  Patient can return home with the following  Assistance with cooking/housework;Assist for transportation;Help with stairs or ramp for entrance    Equipment Recommendations None recommended by PT  Recommendations for Other Services       Functional Status Assessment Patient has had a recent decline in their functional status and demonstrates  the ability to make significant improvements in function in a reasonable and predictable amount of time.     Precautions / Restrictions Precautions Precautions: Fall;Anterior Hip Restrictions Weight Bearing Restrictions: Yes LLE Weight Bearing: Weight bearing as tolerated      Mobility  Bed Mobility Overal bed mobility: Modified Independent                  Transfers Overall transfer level: Needs assistance Equipment used: Rolling walker (2 wheels) Transfers: Sit to/from Stand Sit to Stand: Supervision                Ambulation/Gait Ambulation/Gait assistance: Supervision, Min guard Gait Distance (Feet): 60 Feet Assistive device: Rolling walker (2 wheels)         General Gait Details: pt able to progress to step through gait pattern with time, good weight bearing in BLE noted  Stairs            Wheelchair Mobility    Modified Rankin (Stroke Patients Only)       Balance Overall balance assessment: Needs assistance Sitting-balance support: Feet supported Sitting balance-Leahy Scale: Good       Standing balance-Leahy Scale: Fair                               Pertinent Vitals/Pain Pain Assessment Pain Assessment: 0-10 Pain Score: 5  Pain Location: L hip/thigh Pain Descriptors / Indicators: Aching, Sore Pain Intervention(s): Limited activity within patient's tolerance, Monitored during session, Repositioned    Home Living Family/patient expects to be discharged to:: Private residence Living Arrangements: Alone Available Help at Discharge: Family;Available 24 hours/day Type  of Home: House Home Access: Stairs to enter Entrance Stairs-Rails: Right Entrance Stairs-Number of Steps: 4 Alternate Level Stairs-Number of Steps: 8 Home Layout: Two level;Bed/bath upstairs Home Equipment: Cane - single Librarian, academic (2 wheels)      Prior Function Prior Level of Function : Independent/Modified Independent                      Hand Dominance        Extremity/Trunk Assessment   Upper Extremity Assessment Upper Extremity Assessment: Overall WFL for tasks assessed    Lower Extremity Assessment Lower Extremity Assessment: Overall WFL for tasks assessed    Cervical / Trunk Assessment Cervical / Trunk Assessment: Normal  Communication   Communication: No difficulties  Cognition Arousal/Alertness: Awake/alert Behavior During Therapy: WFL for tasks assessed/performed Overall Cognitive Status: Within Functional Limits for tasks assessed                                          General Comments      Exercises Total Joint Exercises Ankle Circles/Pumps: AROM, Both, 15 reps Quad Sets: AROM, Strengthening, Left, 10 reps Gluteal Sets: AROM, Strengthening, Both, 15 reps Heel Slides: AROM, Strengthening, Left, 5 reps   Assessment/Plan    PT Assessment Patient needs continued PT services  PT Problem List Decreased strength;Decreased mobility;Decreased range of motion;Decreased activity tolerance;Decreased balance;Pain;Decreased knowledge of use of DME;Decreased knowledge of precautions       PT Treatment Interventions DME instruction;Therapeutic exercise;Gait training;Balance training;Stair training;Neuromuscular re-education;Functional mobility training;Therapeutic activities;Patient/family education    PT Goals (Current goals can be found in the Care Plan section)  Acute Rehab PT Goals Patient Stated Goal: to get back to walking normally PT Goal Formulation: With patient Time For Goal Achievement: 06/30/21    Frequency BID     Co-evaluation               AM-PAC PT "6 Clicks" Mobility  Outcome Measure Help needed turning from your back to your side while in a flat bed without using bedrails?: None Help needed moving from lying on your back to sitting on the side of a flat bed without using bedrails?: None Help needed moving to and from a bed to a chair (including  a wheelchair)?: None Help needed standing up from a chair using your arms (e.g., wheelchair or bedside chair)?: None Help needed to walk in hospital room?: None Help needed climbing 3-5 steps with a railing? : A Little 6 Click Score: 23    End of Session Equipment Utilized During Treatment: Gait belt Activity Tolerance: Patient tolerated treatment well Patient left: in bed;with call bell/phone within reach;with bed alarm set Nurse Communication: Mobility status PT Visit Diagnosis: Other abnormalities of gait and mobility (R26.89);Difficulty in walking, not elsewhere classified (R26.2);Muscle weakness (generalized) (M62.81);Pain Pain - Right/Left: Left Pain - part of body: Hip    Time: 5573-2202 PT Time Calculation (min) (ACUTE ONLY): 30 min   Charges:   PT Evaluation $PT Eval Low Complexity: 1 Low PT Treatments $Therapeutic Exercise: 8-22 mins $Therapeutic Activity: 8-22 mins        Olga Coaster PT, DPT 2:59 PM,06/16/21

## 2021-06-17 ENCOUNTER — Encounter: Payer: Self-pay | Admitting: Orthopedic Surgery

## 2021-06-17 DIAGNOSIS — M1612 Unilateral primary osteoarthritis, left hip: Secondary | ICD-10-CM | POA: Diagnosis not present

## 2021-06-17 LAB — BASIC METABOLIC PANEL
Anion gap: 8 (ref 5–15)
BUN: 19 mg/dL (ref 6–20)
CO2: 24 mmol/L (ref 22–32)
Calcium: 8.5 mg/dL — ABNORMAL LOW (ref 8.9–10.3)
Chloride: 102 mmol/L (ref 98–111)
Creatinine, Ser: 0.8 mg/dL (ref 0.61–1.24)
GFR, Estimated: >60 mL/min (ref >60)
Glucose, Bld: 136 mg/dL — ABNORMAL HIGH (ref 70–99)
Potassium: 3.5 mmol/L (ref 3.5–5.1)
Sodium: 134 mmol/L — ABNORMAL LOW (ref 135–145)

## 2021-06-17 LAB — CBC
HCT: 40.5 % (ref 39.0–52.0)
Hemoglobin: 12.6 g/dL — ABNORMAL LOW (ref 13.0–17.0)
MCH: 26.7 pg (ref 26.0–34.0)
MCHC: 31.1 g/dL (ref 30.0–36.0)
MCV: 85.8 fL (ref 80.0–100.0)
Platelets: 213 10*3/uL (ref 150–400)
RBC: 4.72 MIL/uL (ref 4.22–5.81)
RDW: 16.8 % — ABNORMAL HIGH (ref 11.5–15.5)
WBC: 9.4 10*3/uL (ref 4.0–10.5)
nRBC: 0 % (ref 0.0–0.2)

## 2021-06-17 LAB — GLUCOSE, CAPILLARY
Glucose-Capillary: 129 mg/dL — ABNORMAL HIGH (ref 70–99)
Glucose-Capillary: 167 mg/dL — ABNORMAL HIGH (ref 70–99)

## 2021-06-17 MED ORDER — INSULIN ASPART 100 UNIT/ML IJ SOLN
INTRAMUSCULAR | Status: AC
  Administered 2021-06-17: 2 [IU] via SUBCUTANEOUS
  Filled 2021-06-17: qty 1

## 2021-06-17 MED ORDER — OXYCODONE HCL 5 MG PO TABS
ORAL_TABLET | ORAL | Status: AC
  Filled 2021-06-17: qty 2

## 2021-06-17 MED ORDER — OXYCODONE HCL 5 MG PO TABS
ORAL_TABLET | ORAL | Status: AC
  Administered 2021-06-17: 10 mg via ORAL
  Filled 2021-06-17: qty 2

## 2021-06-17 MED ORDER — ACETAMINOPHEN 325 MG PO TABS
ORAL_TABLET | ORAL | Status: AC
  Filled 2021-06-17: qty 2

## 2021-06-17 MED ORDER — METHOCARBAMOL 500 MG PO TABS: 500.0000 mg | ORAL_TABLET | Freq: Four times a day (QID) | ORAL | 0 refills | Status: AC | PRN

## 2021-06-17 MED ORDER — ASPIRIN 81 MG PO CHEW: 81.0000 mg | CHEWABLE_TABLET | Freq: Two times a day (BID) | ORAL | 0 refills | Status: AC

## 2021-06-17 MED ORDER — ASPIRIN 81 MG PO CHEW
CHEWABLE_TABLET | ORAL | Status: AC
  Administered 2021-06-17: 81 mg via ORAL
  Filled 2021-06-17: qty 1

## 2021-06-17 MED ORDER — KETOROLAC TROMETHAMINE 15 MG/ML IJ SOLN
INTRAMUSCULAR | Status: AC
  Administered 2021-06-17: 15 mg via INTRAVENOUS
  Filled 2021-06-17: qty 1

## 2021-06-17 MED ORDER — INSULIN ASPART 100 UNIT/ML IJ SOLN
INTRAMUSCULAR | Status: AC
  Administered 2021-06-17: 3 [IU] via SUBCUTANEOUS
  Filled 2021-06-17: qty 1

## 2021-06-17 MED ORDER — KETOROLAC TROMETHAMINE 15 MG/ML IJ SOLN
INTRAMUSCULAR | Status: AC
  Filled 2021-06-17: qty 1

## 2021-06-17 MED ORDER — OXYCODONE-ACETAMINOPHEN 5-325 MG PO TABS
ORAL_TABLET | ORAL | Status: AC
  Filled 2021-06-17: qty 1

## 2021-06-17 MED ORDER — OXYCODONE HCL 5 MG PO TABS: 5.0000 mg | ORAL_TABLET | ORAL | 0 refills | Status: AC | PRN

## 2021-06-17 MED ORDER — METHOCARBAMOL 500 MG PO TABS
ORAL_TABLET | ORAL | Status: AC
  Administered 2021-06-17: 500 mg via ORAL
  Filled 2021-06-17: qty 1

## 2021-06-17 NOTE — Progress Notes (Addendum)
Physical Therapy Treatment Patient Details Name: [name] Jensen MRN: [id number] DOB: [birth year] Today's Date: 06/17/2021   History of Present Illness 61 yo male s/p L THA 06/16/21 (anterior approach), had R hip replaced 11/22. PMH of HTN, anxiety, depression, DMII, R THA.    PT Comments    Pt did well with second session today, still having pain but much more manageable after muscle relaxer and some rest.  He was able to ambulate 250 ft ft and go up/down steps a few times all w/o any direct assist.  Further discussed home management, course of recovery and continued PT.  Pt moving well, showing great motivation and states that he is doing better POD1 this time than 4 months ago with the R hip replacement.  Pt eager to go home, safe to do so from a PT stand-point.    Recommendations for follow up therapy are one component of a multi-disciplinary discharge planning process, led by the attending physician.  Recommendations may be updated based on patient status, additional functional criteria and insurance authorization.  Follow Up Recommendations  Follow physician's recommendations for discharge plan and follow up therapies     Assistance Recommended at Discharge Intermittent Supervision/Assistance  Patient can return home with the following Assistance with cooking/housework;Assist for transportation;Help with stairs or ramp for entrance   Equipment Recommendations  None recommended by PT    Recommendations for Other Services       Precautions / Restrictions Precautions Precautions: Fall;Anterior Hip Restrictions LLE Weight Bearing: Weight bearing as tolerated     Mobility  Bed Mobility Overal bed mobility: Modified Independent             General bed mobility comments: Pt able to get out of, back into bed w/o physical assist    Transfers Overall transfer level: Modified independent Equipment used: Rolling walker (2 wheels) Transfers: Sit to/from Stand Sit to Stand: From  elevated surface, Modified independent (Device/Increase time)           General transfer comment: Pt confident rise from bed elevated 2-3", heavy UE reliance w/o safety issues.    Ambulation/Gait Ambulation/Gait assistance: Supervision Gait Distance (Feet): 250 Feet Assistive device: Rolling walker (2 wheels)         General Gait Details: Pt with less pain during standing this session, though still with subjective incision and lateral thigh pain and minimal limp.  Able to eventually attain consistent walker momentum and cadence   Stairs Stairs: Yes Stairs assistance: Supervision Stair Management: One rail Right Number of Stairs: 8 General stair comments: pt able to negotiate up/down steps with out assist, confident w/o cuing as he was able to manage a few months ago after the contralateral hip replacement.  Up/down 4 steps while self managing walker and using single UE on rail and then up/down 4 with b/l UE use of rail simulating having someone there to help with AD management.  Pt did have minimal issue with fully clearing L foot/dragging L foot overt lip of step on a few steps, improved with cuing and deliberate hip elevation focus.    Wheelchair Mobility    Modified Rankin (Stroke Patients Only)       Balance Overall balance assessment: Needs assistance Sitting-balance support: Feet supported Sitting balance-Leahy Scale: Good       Standing balance-Leahy Scale: Good                              Cognition  Arousal/Alertness: Awake/alert Behavior During Therapy: WFL for tasks assessed/performed Overall Cognitive Status: Within Functional Limits for tasks assessed                                          Exercises Total Joint Exercises Quad Sets: Strengthening, 10 reps Short Arc Quad: Strengthening, 10 reps Heel Slides: Strengthening, 10 reps (with resisted leg ext) Hip ABduction/ADduction: Strengthening, 10 reps Straight Leg  Raises: AAROM, 5 reps    General Comments        Pertinent Vitals/Pain Pain Assessment Pain Assessment: 0-10 Pain Score: 5  Pain Location: L hip/lateral thigh    Home Living                          Prior Function            PT Goals (current goals can now be found in the care plan section) Progress towards PT goals: Progressing toward goals    Frequency    BID      PT Plan Current plan remains appropriate    Co-evaluation              AM-PAC PT "6 Clicks" Mobility   Outcome Measure  Help needed turning from your back to your side while in a flat bed without using bedrails?: None Help needed moving from lying on your back to sitting on the side of a flat bed without using bedrails?: None Help needed moving to and from a bed to a chair (including a wheelchair)?: None Help needed standing up from a chair using your arms (e.g., wheelchair or bedside chair)?: None Help needed to walk in hospital room?: None Help needed climbing 3-5 steps with a railing? : None 6 Click Score: 24    End of Session Equipment Utilized During Treatment: Gait belt Activity Tolerance: Patient tolerated treatment well Patient left: in bed;with call bell/phone within reach;with bed alarm set Nurse Communication: Mobility status PT Visit Diagnosis: Other abnormalities of gait and mobility (R26.89);Difficulty in walking, not elsewhere classified (R26.2);Muscle weakness (generalized) (M62.81);Pain Pain - Right/Left: Left Pain - part of body: Hip     Time: 6295-2841 PT Time Calculation (min) (ACUTE ONLY): 31 min  Charges:  $Gait Training: 23-37 mins $Therapeutic Exercise: 8-22 mins                     Malachi Pro, DPT 06/17/2021, 1:13 PM

## 2021-06-17 NOTE — Discharge Summary (Signed)
Physician Discharge Summary  Patient ID: [name] Jensen MRN: [id number] DOB/AGE: [birth year] 61 y.o.  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    He was given perioperative antibiotics:  Anti-infectives (From admission, onward)    Start     Dose/Rate Route Frequency Ordered Stop   06/16/21 1922  ceFAZolin (ANCEF) 2-4 GM/100ML-% IVPB       Note to Pharmacy: Stefano Gaul H: cabinet override      06/16/21 1922 06/17/21 0111   06/16/21 1400  ceFAZolin (ANCEF) IVPB 2g/100 mL premix        2 g 200 mL/hr over 30 Minutes Intravenous Every 6 hours 06/16/21 1218 06/17/21 0106   06/16/21 0632  ceFAZolin (ANCEF) 2-4 GM/100ML-% IVPB       Note to Pharmacy: Desma Paganini S: cabinet override      06/16/21 0632 06/17/21 0106   06/16/21 0600  ceFAZolin (ANCEF) IVPB 2g/100 mL premix        2 g 200 mL/hr over 30 Minutes Intravenous On call to O.R. 06/16/21 5747 06/16/21 3403     .  He was given sequential compression devices, early ambulation, and aspirin  81 mg twice a day X 30 days for DVT prophylaxis.  He benefited maximally from the hospital stay and there were no complications.    Recent vital signs:  Vitals:   06/17/21 0915 06/17/21 1141  BP:  139/77  Pulse:  (!) 102  Resp:  16  Temp: (!) 100.8 F (38.2 C) (!) 96.6 F (35.9 C)  SpO2:  97%    Recent laboratory studies:  Lab Results  Component Value Date   HGB 12.6 (L) 06/17/2021   HGB 13.6 06/05/2021   HGB 13.5 03/23/2021   Lab Results  Component Value Date   WBC 9.4 06/17/2021   PLT 213 06/17/2021   Lab Results  Component Value Date   INR 1.1 06/05/2021   Lab Results  Component Value Date   NA 134 (L) 06/17/2021   K 3.5 06/17/2021   CL 102 06/17/2021   CO2 24 06/17/2021   BUN 19 06/17/2021   CREATININE 0.80 06/17/2021   GLUCOSE 136 (H) 06/17/2021    Discharge Medications:   Allergies as of 06/17/2021   No Known Allergies      Medication List     STOP taking these medications    HYDROcodone-acetaminophen 5-325 MG tablet Commonly known as: NORCO/VICODIN       TAKE these medications    acetaminophen 500 MG tablet Commonly  known as: TYLENOL Take 1,000 mg by mouth every 6 (six) hours as needed.   amLODipine 10 MG tablet Commonly known as: NORVASC Take 10 mg by mouth every morning.   aspirin 81 MG chewable tablet Chew 1 tablet (81 mg total) by mouth 2 (two) times daily.   brimonidine 0.2 % ophthalmic solution Commonly known as: ALPHAGAN Place 1 drop into both eyes 2 (two) times daily.   dapagliflozin propanediol 5 MG Tabs tablet Commonly known as: FARXIGA Take 10 mg by mouth in the morning.   diazepam 5 MG tablet Commonly known as: Valium Take 1 tablet (5 mg total) by mouth every 12 (twelve) hours as needed (severe dizziness).   docusate sodium 100 MG capsule Commonly known as: COLACE Take 1 capsule (100 mg total) by mouth 2 (two) times daily.   fluticasone 50 MCG/ACT nasal spray Commonly known as: Flonase Place 1 spray into both  nostrils daily.   furosemide 20 MG tablet Commonly known as: LASIX Take 20 mg by mouth in the morning.   glimepiride 4 MG tablet Commonly known as: AMARYL TAKE 1 TABLET BY MOUTH EVERY DAY   latanoprost 0.005 % ophthalmic solution Commonly known as: XALATAN Place 1 drop into both eyes at bedtime.   lisinopril-hydrochlorothiazide 20-25 MG tablet Commonly known as: ZESTORETIC TAKE 1 TABLET BY MOUTH EVERY DAY   meclizine 25 MG tablet Commonly known as: ANTIVERT Take 1 tablet (25 mg total) by mouth 3 (three) times daily as needed for dizziness.   Melatonin 10 MG Tabs Take 20 mg by mouth at bedtime.   metFORMIN 500 MG tablet Commonly known as: GLUCOPHAGE Take 500 mg by mouth in the morning, at noon, in the evening, and at bedtime.   methocarbamol 500 MG tablet Commonly known as: ROBAXIN Take 1 tablet (500 mg total) by mouth every 6 (six) hours as needed for muscle spasms. What changed: Another medication with the same name was added. Make sure you understand how and when to take each.   methocarbamol 500 MG tablet Commonly known as: ROBAXIN Take 1 tablet (500 mg total) by mouth every 6 (six) hours as needed for muscle spasms. What changed: You were already taking a medication with the same name, and this prescription was added. Make sure you understand how and when to take each.   oxyCODONE 5 MG immediate release tablet Commonly known as: Oxy IR/ROXICODONE Take 1-2 tablets (5-10 mg total) by mouth every 4 (four) hours as needed for moderate pain (pain score 4-6).   sildenafil 100 MG tablet Commonly known as: VIAGRA Take 100 mg by mouth daily as needed.   testosterone cypionate 200 MG/ML injection Commonly known as: DEPOTESTOSTERONE CYPIONATE Inject 200 mg into the skin every 14 (fourteen) days.   timolol 0.5 % ophthalmic solution Commonly known as: TIMOPTIC Place 1 drop into both eyes 2 (two) times daily.               Durable Medical Equipment  (From  admission, onward)           Start     Ordered   06/17/21 1336  For home use only DME 3 n 1  Once        06/17/21 1336   06/17/21 1336  For home use only DME Walker rolling  Once       Question Answer Comment  Walker: With 5 Inch Wheels   Patient needs a walker to treat with the following condition Osteoarthritis of left hip  06/17/21 1336   06/16/21 1555  DME Walker rolling  Once       Question:  Patient needs a walker to treat with the following condition  Answer:  History of total hip replacement, left   06/16/21 1554   06/16/21 1555  DME Bedside commode  Once       Question:  Patient needs a bedside commode to treat with the following condition  Answer:  History of total hip replacement, left   06/16/21 1554   06/16/21 1218  DME 3 n 1  Once        06/16/21 1218            Diagnostic Studies: DG C-Arm 1-60 Min-No Report  Result Date: 06/16/2021 Fluoroscopy was utilized by the requesting physician.  No radiographic interpretation.   DG C-Arm 1-60 Min-No Report  Result Date: 06/16/2021 Fluoroscopy was utilized by the requesting physician.  No radiographic interpretation.   DG HIP UNILAT WITH PELVIS 1V LEFT  Result Date: 06/16/2021 CLINICAL DATA:  LEFT hip replacement EXAM: DG HIP (WITH OR WITHOUT PELVIS) 1V*L* COMPARISON:  None fluoroscopy time: 0 minutes 8 seconds Dose: 2.7901 mGy Images: 5 FINDINGS: Images demonstrate resection of proximal LEFT femur and placement of a total LEFT hip prosthesis. No fracture or dislocation identified. IMPRESSION: LEFT total hip arthroplasty without acute complication. Electronically Signed   By: Ulyses Southward M.D.   On: 06/16/2021 09:54    Disposition: Discharge disposition: 01-Home or Self Care            Signed: Altamese Cabal ,PA-C 06/17/2021, 1:36 PM

## 2021-06-17 NOTE — TOC Progression Note (Signed)
Transition of Care [facility]) - Progression Note    Patient Details  Name: [name] Jensen MRN: [id number] Date of Birth: 1961-02-14  Transition of Care [facility]) CM/SW [place], RN Phone Number: 06/17/2021, 3:23 PM  Clinical Narrative:   The patient has appointment to go to [facility] pT on Monday, Has a Cane and RW at home    Expected Discharge Plan: OP Rehab Barriers to Discharge: Continued Medical Work up  Expected Discharge Plan and Services Expected Discharge Plan: OP Rehab   Discharge Planning Services: CM Consult     Expected Discharge Date: 06/17/21                                     Social Determinants of Health (SDOH) Interventions    Readmission Risk Interventions No flowsheet data found.

## 2021-06-17 NOTE — Discharge Instructions (Addendum)
No tight elastic waist bands over the bandage. Not wearing underwear is preferred, or pull waist band above the bandage.  May shower with bandage in place.  If bandage becomes saturated, OK to removal and place band-aid.  You may be up walking around as tolerated but should take periodic breaks to elevate your legs.    Pain medication can cause constipation.  You should increase your fluid intake, increase your intake of high fiber foods and/or take Metamucil as needed for constipation.  You may shower.  You do NOT need to cover the dressing or incision site with plastic wrap.  The dressing or incision can get wet, but do not submerge under water.  After your staples have been removed, you should wait 48 hours before submerging incision under water.  Continue your physical therapy exercises at least twice daily.  It is a good idea to use an ice pack for 30 minutes after doing your exercises to reduce swelling.  Do not be surprised if you have increased pain at night.  This usually means you have been a little too active during the day and need to reduce your activities.  If you develop lower extremity swelling that does not improve after a night of elevation, please call the office.  This could be an early sign of a blood clot.  Call with questions, fever>101.5 degrees, shortness of breath or drainage from the wound

## 2021-06-17 NOTE — Plan of Care (Signed)
Patient meets criteria for discharge.

## 2021-06-17 NOTE — Progress Notes (Signed)
Physical Therapy Treatment Patient Details Name: [name] Jensen MRN: [id number] DOB: [birth year] Today's Date: 06/17/2021   History of Present Illness 61 yo male s/p L THA 06/16/21 (anterior approach), had R hip replaced 11/22. PMH of HTN, anxiety, depression, DMII, R THA.    PT Comments    Pt was able to ambulate relatively well but started to become more and more sensitive to pain at incision and lateral thigh and decided that he could not push himself more and needed to turn around at ~60 ft (120 total) and requests more pain meds/muscle relaxers - spoke with nurse about this.  He showed very good quad control and overall strength/Q of M during exercises.  Will plan on performing steps later today.  Recommendations for follow up therapy are one component of a multi-disciplinary discharge planning process, led by the attending physician.  Recommendations may be updated based on patient status, additional functional criteria and insurance authorization.  Follow Up Recommendations  Follow physician's recommendations for discharge plan and follow up therapies     Assistance Recommended at Discharge Intermittent Supervision/Assistance  Patient can return home with the following Assistance with cooking/housework;Assist for transportation;Help with stairs or ramp for entrance   Equipment Recommendations  None recommended by PT    Recommendations for Other Services       Precautions / Restrictions Precautions Precautions: Fall;Anterior Hip Restrictions LLE Weight Bearing: Weight bearing as tolerated     Mobility  Bed Mobility Overal bed mobility: Modified Independent             General bed mobility comments: Pt able to get out of, back into bed w/o physical assist    Transfers Overall transfer level: Needs assistance Equipment used: Rolling walker (2 wheels) Transfers: Sit to/from Stand Sit to Stand: Supervision           General transfer comment: Pt able to rise from  standard height bed, his height made this difficult but with heavy UE use and forward lean he managed    Ambulation/Gait Ambulation/Gait assistance: Supervision, Min guard Gait Distance (Feet): 100 Feet Assistive device: Rolling walker (2 wheels)         General Gait Details: Pt able to show good WBing tolerance and consistent walker movement but maintained a L limp and had lateral thigh pain that caused him to abort further prolonged ambulation.  Good effort, stable vitals but sweating and c/o considerable pain   Stairs             Wheelchair Mobility    Modified Rankin (Stroke Patients Only)       Balance Overall balance assessment: Needs assistance Sitting-balance support: Feet supported Sitting balance-Leahy Scale: Good       Standing balance-Leahy Scale: Fair                              Cognition Arousal/Alertness: Awake/alert Behavior During Therapy: WFL for tasks assessed/performed Overall Cognitive Status: Within Functional Limits for tasks assessed                                          Exercises Total Joint Exercises Quad Sets: Strengthening, 10 reps Short Arc Quad: Strengthening, 10 reps Heel Slides: Strengthening, 10 reps (with resisted leg ext) Hip ABduction/ADduction: Strengthening, 10 reps Straight Leg Raises: AAROM, 5 reps    General Comments  Pertinent Vitals/Pain Pain Assessment Pain Assessment: 0-10 Pain Score: 6  Pain Location: L hip/lateral thigh    Home Living                          Prior Function            PT Goals (current goals can now be found in the care plan section) Progress towards PT goals: Progressing toward goals    Frequency    BID      PT Plan Current plan remains appropriate    Co-evaluation              AM-PAC PT "6 Clicks" Mobility   Outcome Measure  Help needed turning from your back to your side while in a flat bed without using  bedrails?: None Help needed moving from lying on your back to sitting on the side of a flat bed without using bedrails?: None Help needed moving to and from a bed to a chair (including a wheelchair)?: None Help needed standing up from a chair using your arms (e.g., wheelchair or bedside chair)?: A Little Help needed to walk in hospital room?: A Little Help needed climbing 3-5 steps with a railing? : A Little 6 Click Score: 21    End of Session Equipment Utilized During Treatment: Gait belt Activity Tolerance: Patient tolerated treatment well Patient left: in bed;with call bell/phone within reach;with bed alarm set Nurse Communication: Mobility status PT Visit Diagnosis: Other abnormalities of gait and mobility (R26.89);Difficulty in walking, not elsewhere classified (R26.2);Muscle weakness (generalized) (M62.81);Pain Pain - Right/Left: Left Pain - part of body: Hip     Time: 9528-4132 PT Time Calculation (min) (ACUTE ONLY): 33 min  Charges:  $Gait Training: 8-22 mins $Therapeutic Exercise: 8-22 mins                     Malachi Pro, DPT 06/17/2021, 10:37 AM

## 2021-06-17 NOTE — Progress Notes (Signed)
Discharge instructions done with follow up made for February 28th. Has ambulated in room and hall without difficulty. Pain under control. Discharged with Dan Humphreys

## 2021-06-17 NOTE — TOC Progression Note (Signed)
Transition of Care [facility]) - Progression Note    Patient Details  Name: [name] Jensen MRN: [id number] Date of Birth: [birth year]  [facility]) CM/SW Contact  Marlowe Sax, RN Phone Number: 06/17/2021, 3:00 PM  Clinical Narrative:   patient has Rw and cane at home, recommended to go to outpatient PT, no TOC needs         Expected Discharge Plan and Services           Expected Discharge Date: 06/17/21                                     Social Determinants of Health (SDOH) Interventions    Readmission Risk Interventions No flowsheet data found.

## 2021-06-18 LAB — SURGICAL PATHOLOGY

## 2021-09-25 ENCOUNTER — Other Ambulatory Visit: Payer: Self-pay | Admitting: Family Medicine

## 2021-09-25 ENCOUNTER — Ambulatory Visit
Admission: RE | Admit: 2021-09-25 | Discharge: 2021-09-25 | Disposition: A | Discharge status: Home / Self Care | Payer: Medicaid Other | Source: Ambulatory Visit | Attending: Family Medicine | Admitting: Family Medicine

## 2021-09-25 DIAGNOSIS — R600 Localized edema: Secondary | ICD-10-CM | POA: Diagnosis present

## 2023-05-13 IMAGING — US US EXTREM LOW VENOUS*R*
1 series · 14 of 24 positions shown · non-contrast
Comparison: None Available.

CLINICAL DATA: Leg edema

EXAM:
RIGHT LOWER EXTREMITY VENOUS DOPPLER ULTRASOUND
TECHNIQUE: Gray-scale sonography with compression, as well as color and duplex
ultrasound, were performed to evaluate the deep venous system(s)
from the level of the common femoral vein through the popliteal and
proximal calf veins.

[Series 1: us venous img lower uni right (dvt) · portal-venous · 14 of 35 slices shown]
[im 1/35]
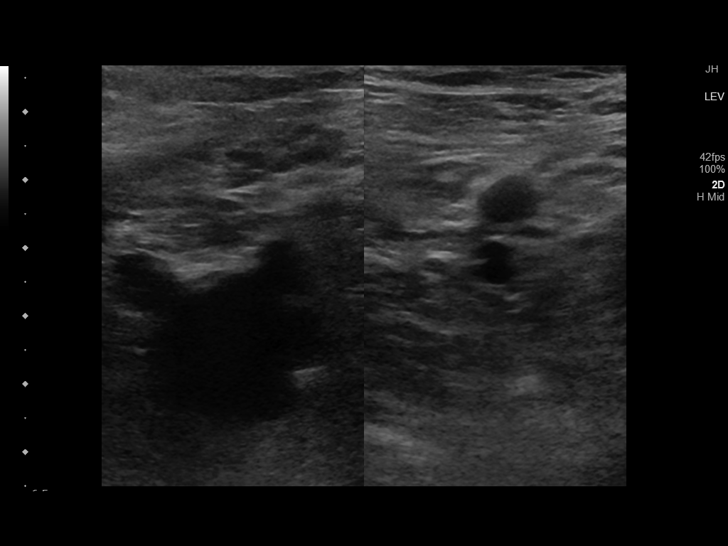
[im 3/35]
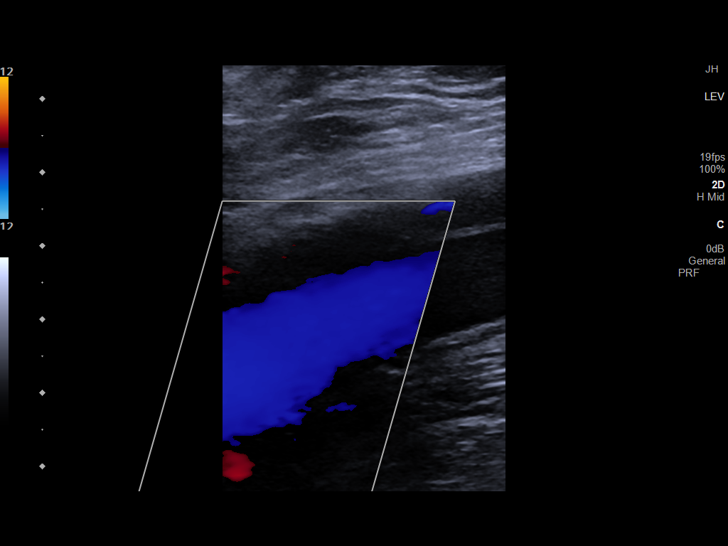
[im 6/35]
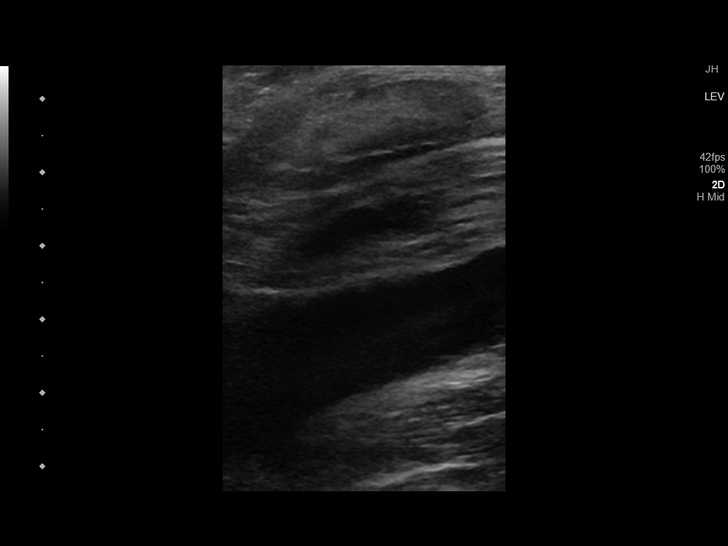
[im 9/35]
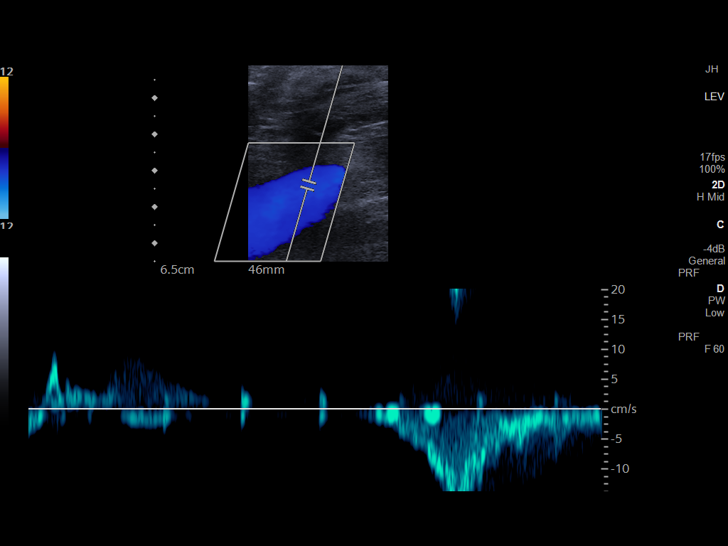
[im 11/35]
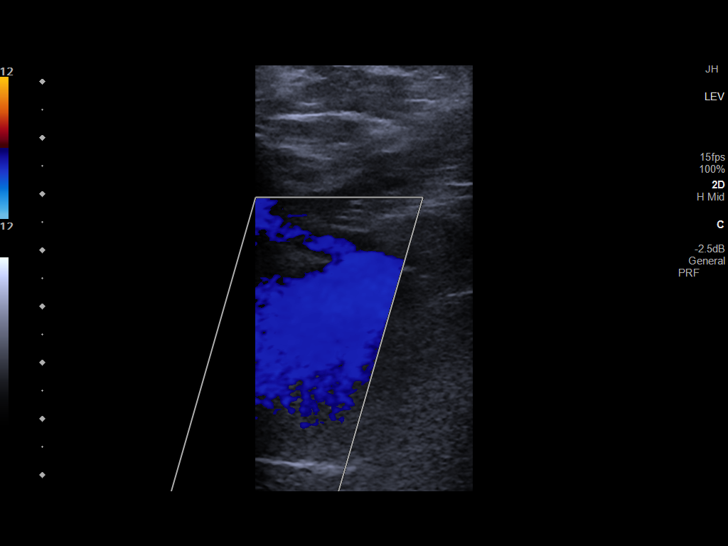
[im 14/35]
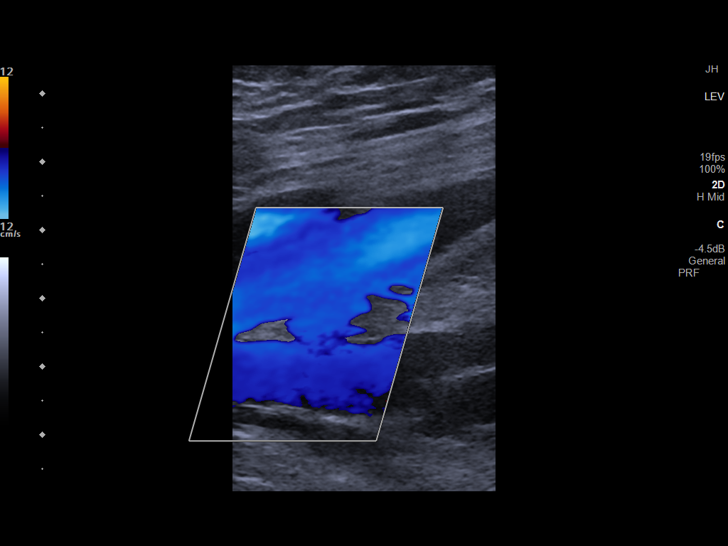
[im 17/35]
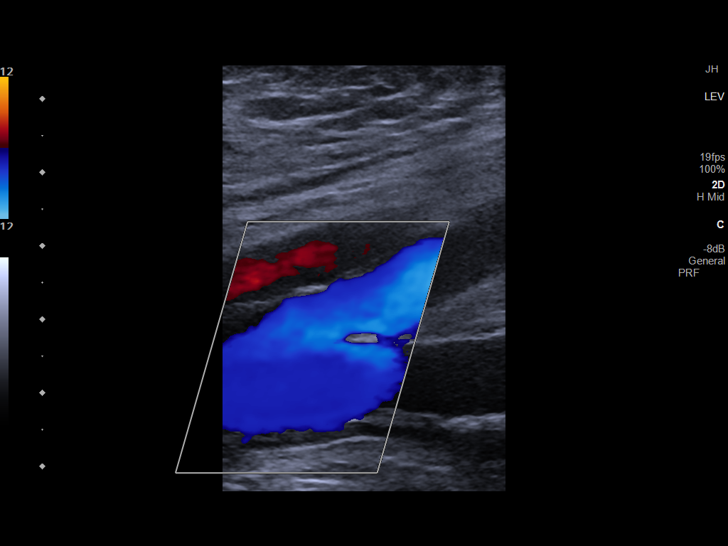
[im 18/35]
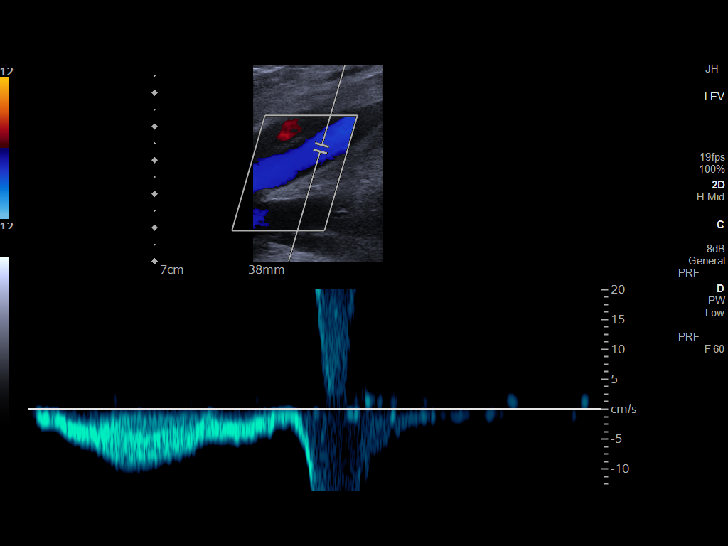
[im 21/35]
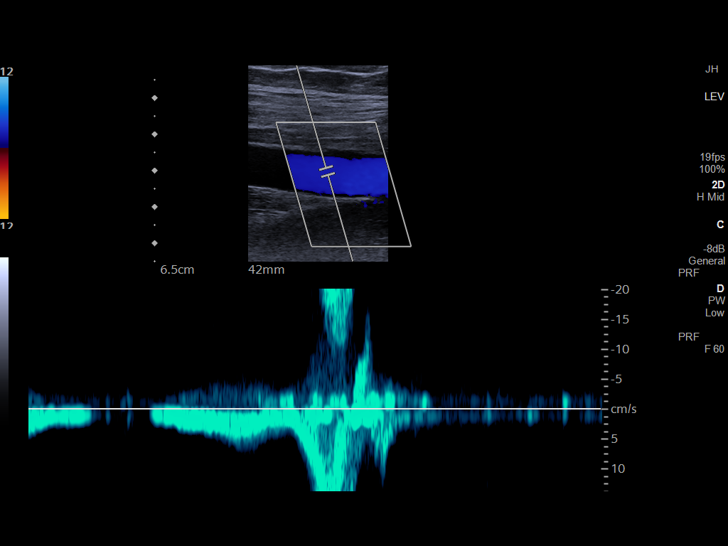
[im 24/35]
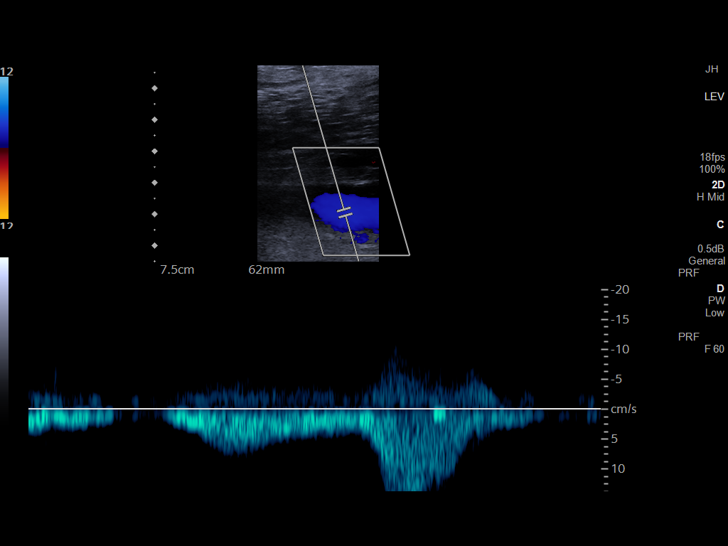
[im 27/35]
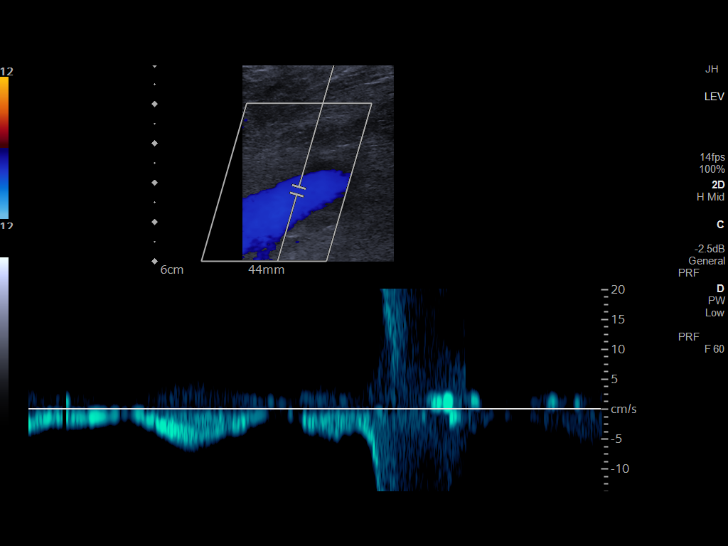
[im 29/35]
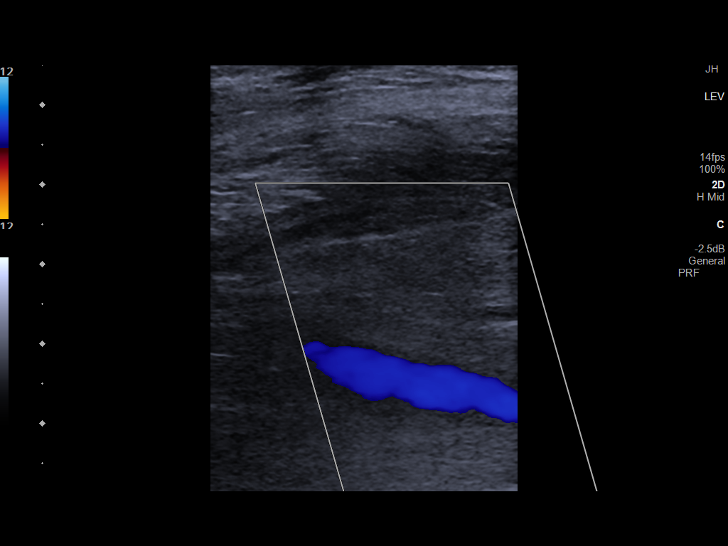
[im 32/35]
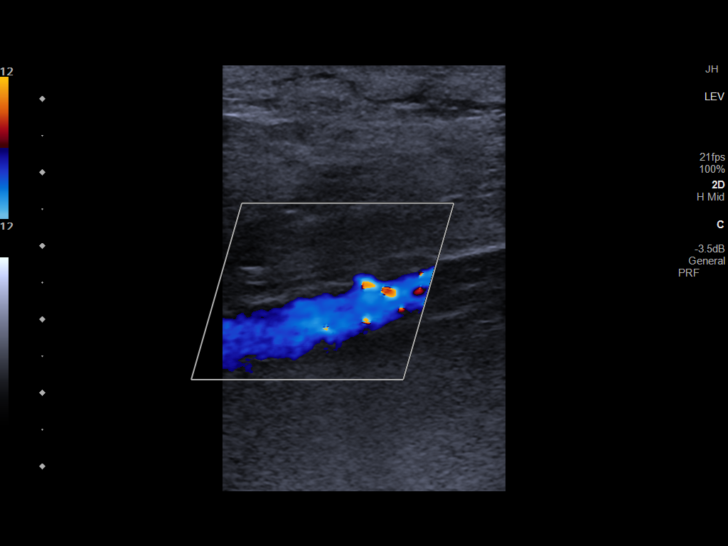
[im 35/35]
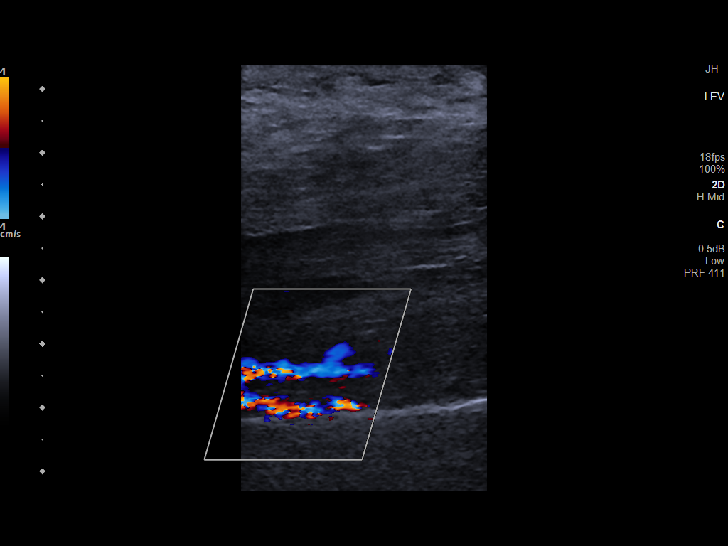

[14 of 24 positions shown; findings below may reference images not displayed]

FINDINGS: VENOUS

Normal compressibility of the common femoral, superficial femoral,
and popliteal veins, as well as the visualized calf veins.
Visualized portions of profunda femoral vein and great saphenous
vein unremarkable. No filling defects to suggest DVT on grayscale or
color Doppler imaging. Doppler waveforms show normal direction of
venous flow, normal respiratory plasticity and response to
augmentation.

Limited views of the contralateral common femoral vein are
unremarkable.

OTHER

Superficial soft tissue edema of the right calf.

Limitations: none
IMPRESSION: 1. Negative examination for deep venous thrombosis of the right
lower extremity.

2.  Superficial soft tissue edema of the right calf.
